# Patient Record
Sex: Female | Born: 1985 | Race: Black or African American | Hispanic: No | Marital: Single | Smoking: Never smoker
Health system: Southern US, Community
[De-identification: ages and names within clinical notes are randomized; demographics above are authoritative.]

## PROBLEM LIST (undated history)

## (undated) DIAGNOSIS — A6000 Herpesviral infection of urogenital system, unspecified: Secondary | ICD-10-CM

## (undated) DIAGNOSIS — R109 Unspecified abdominal pain: Secondary | ICD-10-CM

## (undated) HISTORY — PX: WISDOM TOOTH EXTRACTION: SHX21

## (undated) HISTORY — PX: NO PAST SURGERIES: SHX2092

---
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
## 2006-09-18 ENCOUNTER — Emergency Department (HOSPITAL_COMMUNITY): Admission: EM | Admit: 2006-09-18 | Discharge: 2006-09-18 | Payer: Self-pay | Admitting: Emergency Medicine

## 2006-09-20 ENCOUNTER — Emergency Department (HOSPITAL_COMMUNITY): Admission: EM | Admit: 2006-09-20 | Discharge: 2006-09-20 | Payer: Self-pay | Admitting: Family Medicine

## 2010-06-19 ENCOUNTER — Inpatient Hospital Stay (INDEPENDENT_AMBULATORY_CARE_PROVIDER_SITE_OTHER)
Admission: RE | Admit: 2010-06-19 | Discharge: 2010-06-19 | Disposition: A | Discharge status: Home / Self Care | Payer: 59 | Source: Ambulatory Visit | Attending: Family Medicine | Admitting: Family Medicine

## 2010-06-19 DIAGNOSIS — J029 Acute pharyngitis, unspecified: Secondary | ICD-10-CM

## 2010-06-19 LAB — POCT RAPID STREP A (OFFICE): Streptococcus, Group A Screen (Direct): NEGATIVE

## 2010-06-20 LAB — STREP A DNA PROBE

## 2010-06-21 ENCOUNTER — Inpatient Hospital Stay (INDEPENDENT_AMBULATORY_CARE_PROVIDER_SITE_OTHER)
Admission: RE | Admit: 2010-06-21 | Discharge: 2010-06-21 | Disposition: A | Discharge status: Home / Self Care | Payer: 59 | Source: Ambulatory Visit | Attending: Emergency Medicine | Admitting: Emergency Medicine

## 2010-06-21 DIAGNOSIS — J029 Acute pharyngitis, unspecified: Secondary | ICD-10-CM

## 2010-07-26 ENCOUNTER — Inpatient Hospital Stay (INDEPENDENT_AMBULATORY_CARE_PROVIDER_SITE_OTHER)
Admission: RE | Admit: 2010-07-26 | Discharge: 2010-07-26 | Disposition: A | Discharge status: Home / Self Care | Payer: 59 | Source: Ambulatory Visit | Attending: Emergency Medicine | Admitting: Emergency Medicine

## 2010-07-26 DIAGNOSIS — J020 Streptococcal pharyngitis: Secondary | ICD-10-CM

## 2010-07-26 LAB — POCT RAPID STREP A: Streptococcus, Group A Screen (Direct): POSITIVE — AB

## 2010-11-29 LAB — URINALYSIS, ROUTINE W REFLEX MICROSCOPIC
Bilirubin Urine: NEGATIVE
Nitrite: NEGATIVE
Specific Gravity, Urine: 1.022
Urobilinogen, UA: 0.2

## 2010-11-29 LAB — COMPREHENSIVE METABOLIC PANEL
CO2: 27
Calcium: 9.6
Creatinine, Ser: 0.67
GFR calc non Af Amer: >60
Glucose, Bld: 118 — ABNORMAL HIGH

## 2010-11-29 LAB — URINE MICROSCOPIC-ADD ON

## 2010-11-29 LAB — URINE CULTURE
Colony Count: NO GROWTH
Culture: NO GROWTH

## 2010-11-29 LAB — CBC
Hemoglobin: 13.8
MCHC: 34
MCV: 87.7
RBC: 4.62
RDW: 12.9

## 2010-11-29 LAB — PREGNANCY, URINE: Preg Test, Ur: NEGATIVE

## 2010-11-29 LAB — DIFFERENTIAL
Lymphocytes Relative: 31
Lymphs Abs: 1.3
Neutro Abs: 2.2
Neutrophils Relative %: 54

## 2010-11-29 LAB — LIPASE, BLOOD: Lipase: 26

## 2011-07-02 ENCOUNTER — Encounter (HOSPITAL_COMMUNITY): Payer: Self-pay | Admitting: *Deleted

## 2011-07-02 ENCOUNTER — Emergency Department (HOSPITAL_COMMUNITY)
Admission: EM | Admit: 2011-07-02 | Discharge: 2011-07-02 | Disposition: A | Discharge status: Home / Self Care | Payer: 59 | Source: Home / Self Care | Attending: Emergency Medicine | Admitting: Emergency Medicine

## 2011-07-02 DIAGNOSIS — G510 Bell's palsy: Secondary | ICD-10-CM

## 2011-07-02 HISTORY — DX: Herpesviral infection of urogenital system, unspecified: A60.00

## 2011-07-02 MED ORDER — PREDNISONE 5 MG PO KIT: 1.0000 | PACK | Freq: Every day | ORAL | Status: DC

## 2011-07-02 MED ORDER — VALACYCLOVIR HCL 1 G PO TABS: 1000.0000 mg | ORAL_TABLET | Freq: Three times a day (TID) | ORAL | Status: AC

## 2011-07-02 NOTE — ED Notes (Signed)
Pt with onset of excessive tearing of left eye Thursday evening - Friday am onset of left sided facial drooping and numbness  - left eye not closing completeing continues with excessive tearing

## 2011-07-02 NOTE — ED Provider Notes (Signed)
Chief Complaint  Patient presents with  . Eye Problem  . Facial Droop    History of Present Illness:   April Wells is a 26 year old female who has had a four-day history of excessive tearing of the left eye. The next day she noted left-sided her face was weak and felt somewhat numb. She denies any pain in the face, neck, year, or behind the ear. She has had no headache. She denies any numbness, tingling, or weakness of the upper or lower extremities. No diplopia or blurred vision. No difficulty speaking with the exception of she has trouble pronouncing certain consonant sounds. She denies any eye pain or redness. She is able to completely close her eye on that side. Her left ear feels congested, but she denies any ear pain or changes in her hearing and she denies any dry mouth or changes in her taste. 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  Review of Systems:  Other than noted above, the patient denies any of the following symptoms: Systemic:  No fever, chills, fatigue, photophobia, stiff neck. Eye:  No redness, eye pain, discharge, blurred vision, or diplopia. ENT:  No nasal congestion, rhinorrhea, sinus pressure or pain, sneezing, earache, or sore throat.  No jaw claudication. Neuro:  No paresthesias, loss of consciousness, seizure activity, muscle weakness, trouble with coordination or gait, trouble speaking or swallowing. Psych:  No depression, anxiety or trouble sleeping.  PMFSH:  Past medical history, family history, social history, meds, and allergies were reviewed.  Physical Exam:   Vital signs:  BP 125/69  Pulse 80  Temp(Src) 98.7 F (37.1 C) (Oral)  Resp 18  SpO2 100%  LMP 06/05/2011 General:  Alert and oriented.  In no distress. Eye:  Lids and conjunctivas normal.  PERRL,  Full EOMs.  Fundi benign with normal discs and vessels. ENT:  No cranial or facial tenderness to palpation.  TMs and canals clear.  Nasal mucosa was normal  and uncongested without any drainage. No intra oral lesions, pharynx clear, mucous membranes moist, dentition normal. Neck:  Supple, full ROM, no tenderness to palpation.  No adenopathy or mass. Neuro:  Alert and orented times 3.  Speech was clear, fluent, and appropriate.  Cranial nerve exam reveals a mild left peripheral seventh nerve palsy. She is able to close her eye completely and there is no obvious drooping of her face. No pronator drift, muscle strength normal. Finger to nose normal.  DTRs were 2+ and symmetrical.Station and gait were normal.  Romberg's sign was normal.  Able to perform tandem gait well. Psych:  Normal affect.  Assessment:  The encounter diagnosis was Bell's palsy.  Plan:   1.  The following meds were prescribed:   New Prescriptions   PREDNISONE 5 MG KIT    Take 1 kit (5 mg total) by mouth daily after breakfast. Prednisone 5 mg 6 day dosepack.  Take as directed.   VALACYCLOVIR (VALTREX) 1000 MG TABLET    Take 1 tablet (1,000 mg total) by mouth 3 (three) times daily.   2.  The patient was instructed in symptomatic care and handouts were given. 3.  The patient was told to return if becoming worse in any way, if no better in 3 or 4 days, and given some red flag symptoms that would indicate earlier return.  Follow up:  The patient was told to follow up with Dr. Allyne Gee, her primary care physician, in one week.  Reuben Likes, MD 07/02/11 [phone]

## 2011-07-02 NOTE — Discharge Instructions (Signed)
Use Systane eyedrops and gel.  Protect eyes from foreign bodies.  Bell's Palsy Bell's palsy is a condition in which the muscles on one side of the face cannot move (paralysis). This is because the nerves in the face are paralyzed. It is most often thought to be caused by a virus. The virus causes swelling of the nerve that controls movement on one side of the face. The nerve travels through a tight space surrounded by bone. When the nerve swells, it can be compressed by the bone. This results in damage to the protective covering around the nerve. This damage interferes with how the nerve communicates with the muscles of the face. As a result, it can cause weakness or paralysis of the facial muscles.  Injury (trauma), tumor, and surgery may cause Bell's palsy, but most of the time the cause is unknown. It is a relatively common condition. It starts suddenly (abrupt onset) with the paralysis usually ending within 2 days. Bell's palsy is not dangerous. But because the eye does not close properly, you may need care to keep the eye from getting dry. This can include splinting (to keep the eye shut) or moistening with artificial tears. Bell's palsy very seldom occurs on both sides of the face at the same time. SYMPTOMS   Eyebrow sagging.   Drooping of the eyelid and corner of the mouth.   Inability to close one eye.   Loss of taste on the front of the tongue.   Sensitivity to loud noises.  TREATMENT  The treatment is usually non-surgical. If the patient is seen within the first 24 to 48 hours, a short course of steroids may be prescribed, in an attempt to shorten the length of the condition. Antiviral medicines may also be used with the steroids, but it is unclear if they are helpful.  You will need to protect your eye, if you cannot close it. The cornea (clear covering over your eye) will become dry and can be damaged. Artificial tears can be used to keep your eye moist. Glasses or an eye patch should  be worn to protect your eye. PROGNOSIS  Recovery is variable, ranging from days to months. Although the problem usually goes away completely (about 80% of cases resolve), predicting the outcome is impossible. Most people improve within 3 weeks of when the symptoms began. Improvement may continue for 3 to 6 months. A small number of people have moderate to severe weakness that is permanent.  HOME CARE INSTRUCTIONS   If your caregiver prescribed medication to reduce swelling in the nerve, use as directed. Do not stop taking the medication unless directed by your caregiver.   Use moisturizing eye drops as needed to prevent drying of your eye, as directed by your caregiver.   Protect your eye, as directed by your caregiver.   Use facial massage and exercises, as directed by your caregiver.   Perform your normal activities, and get your normal rest.  SEEK IMMEDIATE MEDICAL CARE IF:   There is pain, redness or irritation in the eye.   You or your child has an oral temperature above 102 F (38.9 C), not controlled by medicine.  MAKE SURE YOU:   Understand these instructions.   Will watch your condition.   Will get help right away if you are not doing well or get worse.  Document Released: 01/31/2005 Document Revised: 01/20/2011 Document Reviewed: 02/09/2009 [facility] Patient Information 2012 [place], [place].

## 2018-08-08 ENCOUNTER — Encounter (HOSPITAL_COMMUNITY): Payer: Self-pay

## 2018-08-08 ENCOUNTER — Encounter (HOSPITAL_COMMUNITY): Payer: Self-pay | Admitting: Emergency Medicine

## 2018-08-08 ENCOUNTER — Other Ambulatory Visit: Payer: Self-pay

## 2018-08-08 ENCOUNTER — Observation Stay (HOSPITAL_COMMUNITY): Payer: BC Managed Care – PPO

## 2018-08-08 ENCOUNTER — Observation Stay (HOSPITAL_COMMUNITY)
Admission: EM | Admit: 2018-08-08 | Discharge: 2018-08-10 | Disposition: A | Discharge status: Home / Self Care | Payer: BC Managed Care – PPO | Attending: Physician Assistant | Admitting: Physician Assistant

## 2018-08-08 ENCOUNTER — Ambulatory Visit (INDEPENDENT_AMBULATORY_CARE_PROVIDER_SITE_OTHER)
Admission: EM | Admit: 2018-08-08 | Discharge: 2018-08-08 | Disposition: A | Discharge status: Home / Self Care | Payer: BC Managed Care – PPO | Source: Home / Self Care

## 2018-08-08 ENCOUNTER — Emergency Department (HOSPITAL_COMMUNITY): Payer: BC Managed Care – PPO

## 2018-08-08 DIAGNOSIS — E876 Hypokalemia: Secondary | ICD-10-CM | POA: Insufficient documentation

## 2018-08-08 DIAGNOSIS — K529 Noninfective gastroenteritis and colitis, unspecified: Secondary | ICD-10-CM | POA: Diagnosis not present

## 2018-08-08 DIAGNOSIS — R1031 Right lower quadrant pain: Secondary | ICD-10-CM

## 2018-08-08 DIAGNOSIS — Z1159 Encounter for screening for other viral diseases: Secondary | ICD-10-CM | POA: Insufficient documentation

## 2018-08-08 DIAGNOSIS — R1011 Right upper quadrant pain: Secondary | ICD-10-CM

## 2018-08-08 DIAGNOSIS — K8020 Calculus of gallbladder without cholecystitis without obstruction: Secondary | ICD-10-CM | POA: Diagnosis not present

## 2018-08-08 DIAGNOSIS — R1084 Generalized abdominal pain: Secondary | ICD-10-CM

## 2018-08-08 DIAGNOSIS — R109 Unspecified abdominal pain: Secondary | ICD-10-CM | POA: Diagnosis present

## 2018-08-08 DIAGNOSIS — R1030 Lower abdominal pain, unspecified: Secondary | ICD-10-CM | POA: Diagnosis present

## 2018-08-08 HISTORY — DX: Unspecified abdominal pain: R10.9

## 2018-08-08 LAB — CBC
HCT: 40.1 % (ref 36.0–46.0)
Hemoglobin: 14 g/dL (ref 12.0–15.0)
MCH: 29.9 pg (ref 26.0–34.0)
MCHC: 34.9 g/dL (ref 30.0–36.0)
MCV: 85.7 fL (ref 80.0–100.0)
Platelets: 201 10*3/uL (ref 150–400)
RBC: 4.68 MIL/uL (ref 3.87–5.11)
RDW: 13.1 % (ref 11.5–15.5)
WBC: 20.9 10*3/uL — ABNORMAL HIGH (ref 4.0–10.5)
nRBC: 0 % (ref 0.0–0.2)

## 2018-08-08 LAB — COMPREHENSIVE METABOLIC PANEL
ALT: 25 U/L (ref 0–44)
AST: 17 U/L (ref 15–41)
Albumin: 4.3 g/dL (ref 3.5–5.0)
Alkaline Phosphatase: 69 U/L (ref 38–126)
Anion gap: 14 (ref 5–15)
BUN: 8 mg/dL (ref 6–20)
CO2: 20 mmol/L — ABNORMAL LOW (ref 22–32)
Calcium: 10 mg/dL (ref 8.9–10.3)
Chloride: 106 mmol/L (ref 98–111)
Creatinine, Ser: 0.97 mg/dL (ref 0.44–1.00)
GFR calc Af Amer: >60 mL/min (ref >60)
GFR calc non Af Amer: >60 mL/min (ref >60)
Glucose, Bld: 150 mg/dL — ABNORMAL HIGH (ref 70–99)
Potassium: 3.6 mmol/L (ref 3.5–5.1)
Sodium: 140 mmol/L (ref 135–145)
Total Bilirubin: 1.1 mg/dL (ref 0.3–1.2)
Total Protein: 9.5 g/dL — ABNORMAL HIGH (ref 6.5–8.1)

## 2018-08-08 LAB — SARS CORONAVIRUS 2 BY RT PCR (HOSPITAL ORDER, PERFORMED IN ~~LOC~~ HOSPITAL LAB): SARS Coronavirus 2: NEGATIVE

## 2018-08-08 LAB — LIPASE, BLOOD: Lipase: 23 U/L (ref 11–51)

## 2018-08-08 LAB — I-STAT BETA HCG BLOOD, ED (MC, WL, AP ONLY): I-stat hCG, quantitative: <5 m[IU]/mL (ref <5)

## 2018-08-08 LAB — POCT PREGNANCY, URINE: Preg Test, Ur: NEGATIVE

## 2018-08-08 MED ORDER — PANTOPRAZOLE SODIUM 40 MG IV SOLR
40.0000 mg | Freq: Every day | INTRAVENOUS | Status: DC
  Administered 2018-08-08 – 2018-08-09 (×2): 40 mg via INTRAVENOUS
  Filled 2018-08-08 (×2): qty 40

## 2018-08-08 MED ORDER — DIPHENHYDRAMINE HCL 50 MG/ML IJ SOLN: 25.0000 mg | Freq: Four times a day (QID) | INTRAMUSCULAR | Status: DC | PRN

## 2018-08-08 MED ORDER — DOCUSATE SODIUM 100 MG PO CAPS
100.0000 mg | ORAL_CAPSULE | Freq: Two times a day (BID) | ORAL | Status: DC
  Administered 2018-08-08 (×2): 100 mg via ORAL
  Filled 2018-08-08 (×2): qty 1

## 2018-08-08 MED ORDER — POLYETHYLENE GLYCOL 3350 17 G PO PACK: 17.0000 g | PACK | Freq: Every day | ORAL | Status: DC | PRN

## 2018-08-08 MED ORDER — ONDANSETRON HCL 4 MG/2ML IJ SOLN: 4.0000 mg | Freq: Four times a day (QID) | INTRAMUSCULAR | Status: DC | PRN

## 2018-08-08 MED ORDER — METOPROLOL TARTRATE 5 MG/5ML IV SOLN: 5.0000 mg | Freq: Four times a day (QID) | INTRAVENOUS | Status: DC | PRN

## 2018-08-08 MED ORDER — MORPHINE SULFATE (PF) 4 MG/ML IV SOLN
4.0000 mg | Freq: Once | INTRAVENOUS | Status: AC
  Administered 2018-08-08: 4 mg via INTRAVENOUS
  Filled 2018-08-08: qty 1

## 2018-08-08 MED ORDER — ONDANSETRON HCL 4 MG/2ML IJ SOLN
4.0000 mg | Freq: Once | INTRAMUSCULAR | Status: AC
  Administered 2018-08-08: 4 mg via INTRAVENOUS
  Filled 2018-08-08: qty 2

## 2018-08-08 MED ORDER — ACETAMINOPHEN 325 MG PO TABS: 650.0000 mg | ORAL_TABLET | Freq: Four times a day (QID) | ORAL | Status: DC | PRN

## 2018-08-08 MED ORDER — ENOXAPARIN SODIUM 40 MG/0.4ML ~~LOC~~ SOLN
40.0000 mg | SUBCUTANEOUS | Status: DC
  Administered 2018-08-08 – 2018-08-09 (×2): 40 mg via SUBCUTANEOUS
  Filled 2018-08-08 (×2): qty 0.4

## 2018-08-08 MED ORDER — KCL IN DEXTROSE-NACL 20-5-0.45 MEQ/L-%-% IV SOLN
INTRAVENOUS | Status: DC
  Administered 2018-08-08 – 2018-08-09 (×2): via INTRAVENOUS
  Filled 2018-08-08 (×3): qty 1000

## 2018-08-08 MED ORDER — HYDROMORPHONE HCL 1 MG/ML IJ SOLN: 0.5000 mg | INTRAMUSCULAR | Status: DC | PRN

## 2018-08-08 MED ORDER — ONDANSETRON 4 MG PO TBDP: 4.0000 mg | ORAL_TABLET | Freq: Four times a day (QID) | ORAL | Status: DC | PRN

## 2018-08-08 MED ORDER — PIPERACILLIN-TAZOBACTAM 3.375 G IVPB
3.3750 g | Freq: Three times a day (TID) | INTRAVENOUS | Status: DC
  Administered 2018-08-08 – 2018-08-10 (×6): 3.375 g via INTRAVENOUS
  Filled 2018-08-08 (×7): qty 50

## 2018-08-08 MED ORDER — ACETAMINOPHEN 650 MG RE SUPP: 650.0000 mg | Freq: Four times a day (QID) | RECTAL | Status: DC | PRN

## 2018-08-08 MED ORDER — IOHEXOL 300 MG/ML  SOLN
100.0000 mL | Freq: Once | INTRAMUSCULAR | Status: AC | PRN
  Administered 2018-08-08: 100 mL via INTRAVENOUS

## 2018-08-08 MED ORDER — KETOROLAC TROMETHAMINE 30 MG/ML IJ SOLN
30.0000 mg | Freq: Four times a day (QID) | INTRAMUSCULAR | Status: DC | PRN
  Administered 2018-08-08 – 2018-08-09 (×2): 30 mg via INTRAVENOUS
  Filled 2018-08-08 (×2): qty 1

## 2018-08-08 MED ORDER — OXYCODONE HCL 5 MG PO TABS
5.0000 mg | ORAL_TABLET | ORAL | Status: DC | PRN
  Administered 2018-08-08: 5 mg via ORAL
  Filled 2018-08-08 (×2): qty 2

## 2018-08-08 MED ORDER — DIPHENHYDRAMINE HCL 25 MG PO CAPS: 25.0000 mg | ORAL_CAPSULE | Freq: Four times a day (QID) | ORAL | Status: DC | PRN

## 2018-08-08 MED ORDER — POLYETHYLENE GLYCOL 3350 17 G PO PACK
17.0000 g | PACK | Freq: Once | ORAL | Status: AC
  Administered 2018-08-08: 17 g via ORAL
  Filled 2018-08-08: qty 1

## 2018-08-08 MED ORDER — SODIUM CHLORIDE 0.9% FLUSH
3.0000 mL | Freq: Once | INTRAVENOUS | Status: AC
  Administered 2018-08-08: 12:00:00 3 mL via INTRAVENOUS

## 2018-08-08 NOTE — ED Provider Notes (Signed)
[place] EMERGENCY DEPARTMENT Provider Note   CSN: [id number] Arrival date & time: 08/08/18  0944     History   Chief Complaint Chief Complaint  Patient presents with  . Abdominal Pain    HPI April Wells is a 33 y.o. female.  Who presents the emergency department sent from [facility] urgent care for evaluation of abdominal pain.  Patient states that she began having pain in her umbilical area and across the upper part of her abdomen 2 days ago.  She states that she felt bloated and distended.  She has constant pain and has been unable to find a position of comfort.  She had several episodes of nonbloody nonbilious vomitus yesterday.  She took Tylenol.  She took several warm baths.  She tried using MiraLAX.  Her mother helped her take an enema.  She did not make a bowel movement since 2 days ago which is very abnormal for her.  She denies diarrhea.  She has no previous abdominal surgeries.  The previous provider examined her abdomen and felt she was tender in the right lower quadrant.  The patient denies any urinary symptoms.  She had her first.  In a very long time starting last Friday.  She has been on Depo-Provera and has not had a period in 5 years.     HPI  Past Medical History:  Diagnosis Date  . Herpes genitalia     There are no active problems to display for this patient.   History reviewed. No pertinent surgical history.   OB History   No obstetric history on file.      Home Medications    Prior to Admission medications   Medication Sig Start Date End Date Taking? Authorizing Provider  PredniSONE 5 MG KIT Take 1 kit (5 mg total) by mouth daily after breakfast. Prednisone 5 mg 6 day dosepack.  Take as directed. 07/02/11   Harden Mo, MD  valACYclovir (VALTREX) 1000 MG tablet Take 1,000 mg by mouth 2 (two) times daily.    [provider]    Family History No family history on file.  Social History Social History   Tobacco  Use  . Smoking status: Never Smoker  . Smokeless tobacco: Never Used  Substance Use Topics  . Alcohol use: Yes  . Drug use: No     Allergies   Patient has no known allergies.   Review of Systems Review of Systems Ten systems reviewed and are negative for acute change, except as noted in the HPI.    Physical Exam Updated Vital Signs BP (!) 142/90 (BP Location: Left Arm)   Temp 98.6 F (37 C) (Oral)   Resp 20   Ht '5\' 8"'  (1.727 m)   Wt 106 kg   SpO2 96%   BMI 35.53 kg/m   Physical Exam Vitals signs and nursing note reviewed.  Constitutional:      General: She is not in acute distress.    Appearance: She is well-developed. She is not diaphoretic.  HENT:     Head: Normocephalic and atraumatic.  Eyes:     General: No scleral icterus.    Conjunctiva/sclera: Conjunctivae normal.  Neck:     Musculoskeletal: Normal range of motion.  Cardiovascular:     Rate and Rhythm: Normal rate and regular rhythm.     Heart sounds: Normal heart sounds. No murmur. No friction rub. No gallop.   Pulmonary:     Effort: Pulmonary effort is normal. No  respiratory distress.     Breath sounds: Normal breath sounds.  Abdominal:     General: Bowel sounds are normal. There is no distension.     Palpations: Abdomen is soft. There is no mass.     Tenderness: There is abdominal tenderness in the right upper quadrant and epigastric area. There is no right CVA tenderness, left CVA tenderness, guarding or rebound.  Skin:    General: Skin is warm and dry.  Neurological:     Mental Status: She is alert and oriented to person, place, and time.  Psychiatric:        Behavior: Behavior normal.      ED Treatments / Results  Labs (all labs ordered are listed, but only abnormal results are displayed) Labs Reviewed  COMPREHENSIVE METABOLIC PANEL - Abnormal; Notable for the following components:      Result Value   CO2 20 (*)    Glucose, Bld 150 (*)    Total Protein 9.5 (*)    All other components  within normal limits  CBC - Abnormal; Notable for the following components:   WBC 20.9 (*)    All other components within normal limits  LIPASE, BLOOD  URINALYSIS, ROUTINE W REFLEX MICROSCOPIC  URINALYSIS, COMPLETE (UACMP) WITH MICROSCOPIC  I-STAT BETA HCG BLOOD, ED ([facility], [facility], [facility] ONLY)  I-STAT BETA HCG BLOOD, ED ([facility], [facility], [facility] ONLY)    EKG    Radiology No results found.  Procedures Procedures (including critical care time)  Medications Ordered in ED Medications  sodium chloride flush (NS) 0.9 % injection 3 mL (3 mLs Intravenous Given 08/08/18 1130)  ondansetron (ZOFRAN) injection 4 mg (4 mg Intravenous Given 08/08/18 1131)  morphine 4 MG/ML injection 4 mg (4 mg Intravenous Given 08/08/18 1131)     Initial Impression / Assessment and Plan / ED Course  I have reviewed the triage vital signs and the nursing notes.  Pertinent labs & imaging results that were available during my care of the patient were reviewed by me and considered in my medical decision making (see chart for details).  Clinical Course as of Aug 09 1354  Wed Aug 08, 2018  1255 Patient with large amount of gallstones.  She is tender in the right upper quadrant.  I placed a call to the general surgeon to ask for consult on the patient.   [AH]    Clinical Course User Index [AH] Margarita Mail, PA-C       33 year old female here with abdominal pain.  On my examination the patient is tender to palpation in the right upper quadrant.  She is nontender in the right lower quadrant.  Differential diagnosis of her lower abdominal considerations include pelvic inflammatory disease, ectopic pregnancy, appendicitis, urinary calculi, primary dysmenorrhea, septic abortion, ruptured ovarian cyst or tumor, ovarian torsion, tubo-ovarian abscess, degeneration of fibroid, endometriosis, diverticulitis, cystitis.  Will begin the examination with a right upper quadrant abdominal ultrasound.The emergent DDX for RUQ pain includes but is  not limited to Glabladder disease, PUD, Acute Hepatitis, Pancreatitis, pyelonephritis, Pneumonia, Lower lobe PE/Infarct, Kidney stone, GERD, retrocecal appendicitis, Fitz-Hugh-Curtis syndrome, AAA, MI, Zoster.  Of note the patient does appear to have an elevated white blood cell count of 20,000.  Her blood sugar is elevated which may be all secondary to acute phase reaction.  She rates her pain at 7 out of 10 and I have offered pain and antinausea medication.  She has no active vomiting, currently.    Patient seen in the emergency department  by PA Claiborne Billings Rayburn off the surgical service.  She will admit the patient for further work-up and observation.  She has ordered a CT scan of the abdomen.  Patient understands and is agreeable with the plan.  I personally reviewed the patient's labs which show negative pregnancy test, leukocytosis of 20,000 without other abnormality.  Blood glucose is 150 patient's COVID test is pending.  Lipase is within normal limits.  I reviewed the patient's right upper quadrant ultrasound which shows multiple gallstones without bladder wall thickening or CBD dilatation.  Patient is stable throughout her ER course and appropriate for admission at this time  Final Clinical Impressions(s) / ED Diagnoses   Final diagnoses:  RUQ abdominal pain    ED Discharge Orders    None       Margarita Mail, PA-C 08/09/18 1401    Virgel Manifold, MD 08/12/18 1113

## 2018-08-08 NOTE — ED Notes (Signed)
Pt. States she is unable to provide urine because she has not had anything to drink.

## 2018-08-08 NOTE — ED Notes (Signed)
Pt. Aware urine is needed. States she is unable to provide any at this time.  

## 2018-08-08 NOTE — ED Notes (Signed)
Patient transported to US 

## 2018-08-08 NOTE — Discharge Instructions (Addendum)
33 year old female presenting for right lower quadrant pain.  Patient states onset was 2 days ago, has never had abdominal surgery.  Patient is very concerned about her appendix, requesting CAT scan.  Discussed that this is not able to be done in the urgent care setting, patient requesting go to ER for further evaluation.

## 2018-08-08 NOTE — ED Triage Notes (Signed)
Onset 3 days ago developed RLQ pain worsening overtime. Seen at urgent care sent to the ED for evaluation.

## 2018-08-08 NOTE — ED Triage Notes (Signed)
Pt states she has been having cramps and abdominal pain x 5 days. Pt states she has not been able sleep. Pt is having right pain.

## 2018-08-08 NOTE — H&P (Signed)
[facility] Admission Note  April Wells Jul 11, 1985  [id number].    Requesting MD: Arthor CaptainAbigail Harris PA-C Chief Complaint/Reason for Consult: Abdominal pain  HPI:  Patient is an otherwise healthy 33 year old female who presented to urgent care this AM with abdominal pain. Pain began 2 days ago and was more located in lower abdomen, felt like what patient describes as menstrual cramps. Pain is now more in upper abdomen on the right. She reports feeling bloated and constipation as well. Patient tried heat, taking baths, antacids, laxatives and an enema with no resolution in pain. She has also had nausea and non-bloody, non-bilious emesis. Last BM was 2 days ago. Denies fever or chills, chest pain, SOB, urinary symptoms. Denies vaginal discharge or pelvic pain but reports LMP 08/03/18. She does not usually have periods as she is on depo-provera. Patient denies past abdominal surgeries. No blood thinning medications. No recent abx or steroids. NKDA. Patient reports occasional alcohol use, denies illicit drug or tobacco use.   ROS: Review of Systems  Constitutional: Negative for chills and fever.  Respiratory: Negative for shortness of breath and wheezing.   Cardiovascular: Negative for chest pain and palpitations.  Gastrointestinal: Positive for abdominal pain, constipation, nausea and vomiting. Negative for blood in stool, diarrhea and melena.  Genitourinary: Negative for dysuria, frequency and urgency.  All other systems reviewed and are negative.   No family history on file.  Past Medical History:  Diagnosis Date  . Herpes genitalia     History reviewed. No pertinent surgical history.  Social History:  reports that she has never smoked. She has never used smokeless tobacco. She reports current alcohol use. She reports that she does not use drugs.  Allergies: No Known Allergies  (Not in a hospital admission)   Blood pressure 116/73, pulse 94, temperature 98.6 F (37 C),  temperature source Oral, resp. rate 17, height 5\' 8"  (1.727 m), weight 106 kg, SpO2 100 %. Physical Exam: Physical Exam Constitutional:      General: She is not in acute distress.    Appearance: She is well-developed. She is obese.  HENT:     Head: Normocephalic and atraumatic.     Right Ear: External ear normal.     Left Ear: External ear normal.     Nose: Nose normal.     Mouth/Throat:     Lips: Pink.     Mouth: Mucous membranes are moist.  Eyes:     General: Lids are normal. No scleral icterus.    Extraocular Movements: Extraocular movements intact.     Conjunctiva/sclera: Conjunctivae normal.  Neck:     Musculoskeletal: Normal range of motion and neck supple.  Cardiovascular:     Rate and Rhythm: Normal rate and regular rhythm.     Pulses:          Radial pulses are 2+ on the right side and 2+ on the left side.       Dorsalis pedis pulses are 2+ on the right side and 2+ on the left side.  Pulmonary:     Effort: Pulmonary effort is normal.     Breath sounds: Normal breath sounds. No decreased breath sounds, wheezing, rhonchi or rales.  Abdominal:     General: Bowel sounds are normal. There is no distension.     Palpations: Abdomen is soft. There is no hepatomegaly or splenomegaly.     Tenderness: There is abdominal tenderness in the right upper quadrant. There is no guarding or rebound. Positive signs  include Murphy's sign.  Musculoskeletal:     Right lower leg: No edema.     Left lower leg: No edema.     Comments: ROM grossly intact in bilateral upper and lower extremities  Skin:    General: Skin is warm and dry.     Findings: No rash.  Neurological:     Mental Status: She is alert and oriented to person, place, and time.  Psychiatric:        Attention and Perception: Attention and perception normal.        Mood and Affect: Mood and affect normal.        Speech: Speech normal.        Behavior: Behavior normal. Behavior is cooperative.     Results for orders placed  or performed during the hospital encounter of 08/08/18 (from the past 48 hour(s))  Lipase, blood     Status: None   Collection Time: 08/08/18 10:04 AM  Result Value Ref Range   Lipase 23 11 - 51 U/L    Comment: Performed at [facility], [address] [place], [place] [address]  Comprehensive metabolic panel     Status: Abnormal   Collection Time: 08/08/18 10:04 AM  Result Value Ref Range   Sodium 140 135 - 145 mmol/L   Potassium 3.6 3.5 - 5.1 mmol/L   Chloride 106 98 - 111 mmol/L   CO2 20 (L) 22 - 32 mmol/L   Glucose, Bld 150 (H) 70 - 99 mg/dL   BUN 8 6 - 20 mg/dL   Creatinine, Ser 6.040.97 0.44 - 1.00 mg/dL   Calcium 54.010.0 8.9 - 98.110.3 mg/dL   Total Protein 9.5 (H) 6.5 - 8.1 g/dL   Albumin 4.3 3.5 - 5.0 g/dL   AST 17 15 - 41 U/L   ALT 25 0 - 44 U/L   Alkaline Phosphatase 69 38 - 126 U/L   Total Bilirubin 1.1 0.3 - 1.2 mg/dL   GFR calc non Af Amer >60 >60 mL/min   GFR calc Af Amer >60 >60 mL/min   Anion gap 14 5 - 15    Comment: Performed at [facility], [address] [place], [place] [address]  CBC     Status: Abnormal   Collection Time: 08/08/18 10:04 AM  Result Value Ref Range   WBC 20.9 (H) 4.0 - 10.5 K/uL   RBC 4.68 3.87 - 5.11 MIL/uL   Hemoglobin 14.0 12.0 - 15.0 g/dL   HCT 82.940.1 56.236.0 - 13.046.0 %   MCV 85.7 80.0 - 100.0 fL   MCH 29.9 26.0 - 34.0 pg   MCHC 34.9 30.0 - 36.0 g/dL   RDW 86.513.1 78.411.5 - 69.615.5 %   Platelets 201 150 - 400 K/uL   nRBC 0.0 0.0 - 0.2 %    Comment: Performed at [facility], [address] [place], [place] [address]  I-Stat beta hCG blood, ED     Status: None   Collection Time: 08/08/18 10:45 AM  Result Value Ref Range   I-stat hCG, quantitative <5.0 <5 mIU/mL   Comment 3            Comment:   GEST. AGE      CONC.  (mIU/mL)   <=1 WEEK        5 - 50     2 WEEKS       50 - 500     3 WEEKS       100 -  10,000     4 WEEKS     1,000 - 30,000        FEMALE AND NON-PREGNANT FEMALE:     LESS THAN 5 mIU/mL    US Abdomen Limited  Ruq  Result Date: 08/08/2018 CLINICAL DATA:  Right upper quadrant pain, nausea and vomiting for 3 days. EXAM: ULTRASOUND ABDOMEN LIMITED RIGHT UPPER QUADRANT COMPARISON:  None. FINDINGS: Gallbladder: Stones are identified in the gallbladder measuring up to 1.6 cm in diameter. There is no gallbladder wall thickening or pericholecystic fluid. Sonographer reports negative Murphy's sign. Common bile duct: Diameter: 0.2 cm Liver: No focal lesion identified. Within normal limits in parenchymal echogenicity. Portal vein is patent on color Doppler imaging with normal direction of blood flow towards the liver. IMPRESSION: Gallstones without evidence of cholecystitis. Electronically Signed   By: Inge Rise M.D.   On: 08/08/2018 12:45      Assessment/Plan Cholelithiasis Abdominal pain - pain initially in lower abdomen but now localized to RUQ - RUQ US shows stones but no gallbladder wall thickening or pericholecystic fluid - could be early cholecystitis or biliary colic  - patient clinically tender in RUQ, but no peritonitis - will get CT abdomen/pelvis to rule out other causes of abdominal with leukocytosis - ok to have liquids for now, will make NPO after midnight for possible OR tomorrow pending CT findings  Leukocytosis - WBC 20, afebrile  FEN: FLD, IVF VTE: SCDs, lovenox ID: [id number] 6/24>>  Admit to observation and repeat labs in AM. CT abd/pelvis to rule out other causes of leukocytosis with abdominal pain. Ok to have liquids tonight, NPO after midnight. IVF and IV abx.   Brigid Re, [facility] 08/08/2018, 1:49 PM Pager: [phone] Consults: [phone]

## 2018-08-08 NOTE — ED Provider Notes (Signed)
[facility]    CSN: [id number] Arrival date & time: 08/08/18  0820     History   Chief Complaint Chief Complaint  Patient presents with  . Appointment    830  . Abdominal Pain    HPI April Wells is a 33 y.o. female her right lower quadrant pain.  Patient states that symptoms onset was 2 days ago.  Patient states that pain is very intense, keeping her up at night.  Patient states that she was having some suprapubic cramping, "like a period" a few days prior, though that is since gone away.  Patient does have a baseline of constipation: Last bowel movement 2 days ago that was without blood or melena.  Patient states that she has tried everything for her pain including heating pads, ibuprofen, Tylenol, topical analgesics, massage without relief.  She states now she feels like she cannot get comfortable.  "The pain hurts no matter what I do ".  Patient is endorsing nausea, no vomiting.  She is afebrile, no known sick contacts or diarrhea.  States that she has been on Depakote for 5 years, currently sexually active with one female partner not wearing condoms routinely.  Patient denies vaginal discharge, vaginal or pelvic pain, vaginal malodor, urinary frequency/urgency, burning with urination.  LMP 08/03/18 - which is "not regular for me because I'm on depo".    Past Medical History:  Diagnosis Date  . Herpes genitalia     There are no active problems to display for this patient.   History reviewed. No pertinent surgical history.  OB History   No obstetric history on file.      Home Medications    Prior to Admission medications   Medication Sig Start Date End Date Taking? Authorizing Provider  PredniSONE 5 MG KIT Take 1 kit (5 mg total) by mouth daily after breakfast. Prednisone 5 mg 6 day dosepack.  Take as directed. 07/02/11   Harden Mo, MD  valACYclovir (VALTREX) 1000 MG tablet Take 1,000 mg by mouth 2 (two) times daily.    [provider]     Family History No family history on file.  Social History Social History   Tobacco Use  . Smoking status: Never Smoker  . Smokeless tobacco: Never Used  Substance Use Topics  . Alcohol use: Yes  . Drug use: No     Allergies   Patient has no known allergies.   Review of Systems As per HPI   Physical Exam Triage Vital Signs ED Triage Vitals [08/08/18 0833]  Enc Vitals Group     BP 109/75     Pulse Rate 99     Resp 18     Temp      Temp Source Oral     SpO2 100 %     Weight      Height      Head Circumference      Peak Flow      Pain Score      Pain Loc      Pain Edu?      Excl. in [place]?    No data found.  Updated Vital Signs BP 109/75 (BP Location: Right Arm)   Pulse 99   Temp 98.3 F (36.8 C) (Oral)   Resp 18   Wt 220 lb (99.8 kg)   SpO2 100%    Physical Exam Constitutional:      General: She is not in acute distress. HENT:     Head:  Normocephalic and atraumatic.  Eyes:     General: No scleral icterus.    Pupils: Pupils are equal, round, and reactive to light.  Cardiovascular:     Rate and Rhythm: Normal rate.  Pulmonary:     Effort: Pulmonary effort is normal.  Abdominal:     General: Abdomen is protuberant. Bowel sounds are normal. There is no distension. There are no signs of injury.     Palpations: Abdomen is soft. There is no hepatomegaly or splenomegaly.     Tenderness: There is abdominal tenderness in the right lower quadrant. There is no right CVA tenderness, left CVA tenderness or rebound. Positive signs include McBurney's sign. Negative signs include Murphy's sign, Rovsing's sign and obturator sign.  Skin:    General: Skin is warm.     Coloration: Skin is not cyanotic, jaundiced, mottled or pale.  Neurological:     Mental Status: She is alert and oriented to person, place, and time.      UC Treatments / Results  Labs (all labs ordered are listed, but only abnormal results are displayed) Labs Reviewed  POC URINE PREG, ED  POCT  PREGNANCY, URINE    EKG None  Radiology No results found.  Procedures Procedures (including critical care time)  Medications Ordered in UC Medications - No data to display  Initial Impression / Assessment and Plan / UC Course  I have reviewed the triage vital signs and the nursing notes.  Pertinent labs & imaging results that were available during my care of the patient were reviewed by me and considered in my medical decision making (see chart for details).     Patient hemodynamically stable, physical exam is somewhat unremarkable giving patient's reported pain level.  I am not sure that acute appendicitis is most likely though it cannot be ruled out definitively in the urgent care setting.  Urine pregnant negative in office.  DDX including ovarian torsion, ovarian cyst, constipation, gas, menstrual cramps, ectopic pregnancy. Final Clinical Impressions(s) / UC Diagnoses   Final diagnoses:  Pain, abdominal, RLQ     Discharge Instructions     33 year old female presenting for right lower quadrant pain.  Patient states onset was 2 days ago, has never had abdominal surgery.  Patient is very concerned about her appendix, requesting CAT scan.  Discussed that this is not able to be done in the urgent care setting, patient requesting go to ER for further evaluation.    ED Prescriptions    None     Controlled Substance Prescriptions [place] Controlled Substance Registry consulted? Not Applicable   Quincy Sheehan, [place] 08/08/18 6440

## 2018-08-09 ENCOUNTER — Encounter (HOSPITAL_COMMUNITY): Payer: Self-pay | Admitting: General Practice

## 2018-08-09 LAB — COMPREHENSIVE METABOLIC PANEL
ALT: 23 U/L (ref 0–44)
AST: 14 U/L — ABNORMAL LOW (ref 15–41)
Albumin: 3.6 g/dL (ref 3.5–5.0)
Alkaline Phosphatase: 59 U/L (ref 38–126)
Anion gap: 10 (ref 5–15)
BUN: 7 mg/dL (ref 6–20)
CO2: 25 mmol/L (ref 22–32)
Calcium: 9.4 mg/dL (ref 8.9–10.3)
Chloride: 104 mmol/L (ref 98–111)
Creatinine, Ser: 0.96 mg/dL (ref 0.44–1.00)
GFR calc Af Amer: >60 mL/min (ref >60)
GFR calc non Af Amer: >60 mL/min (ref >60)
Glucose, Bld: 160 mg/dL — ABNORMAL HIGH (ref 70–99)
Potassium: 3 mmol/L — ABNORMAL LOW (ref 3.5–5.1)
Sodium: 139 mmol/L (ref 135–145)
Total Bilirubin: 0.7 mg/dL (ref 0.3–1.2)
Total Protein: 7.8 g/dL (ref 6.5–8.1)

## 2018-08-09 LAB — CBC
HCT: 34.8 % — ABNORMAL LOW (ref 36.0–46.0)
Hemoglobin: 12.2 g/dL (ref 12.0–15.0)
MCH: 29.7 pg (ref 26.0–34.0)
MCHC: 35.1 g/dL (ref 30.0–36.0)
MCV: 84.7 fL (ref 80.0–100.0)
Platelets: 188 10*3/uL (ref 150–400)
RBC: 4.11 MIL/uL (ref 3.87–5.11)
RDW: 13.2 % (ref 11.5–15.5)
WBC: 10.9 10*3/uL — ABNORMAL HIGH (ref 4.0–10.5)
nRBC: 0 % (ref 0.0–0.2)

## 2018-08-09 LAB — SURGICAL PCR SCREEN
MRSA, PCR: NEGATIVE
Staphylococcus aureus: NEGATIVE

## 2018-08-09 LAB — URINALYSIS, COMPLETE (UACMP) WITH MICROSCOPIC
Bilirubin Urine: NEGATIVE
Glucose, UA: NEGATIVE mg/dL
Ketones, ur: 5 mg/dL — AB
Nitrite: NEGATIVE
Protein, ur: 30 mg/dL — AB
Specific Gravity, Urine: 1.031 — ABNORMAL HIGH (ref 1.005–1.030)
pH: 5 (ref 5.0–8.0)

## 2018-08-09 LAB — HIV ANTIBODY (ROUTINE TESTING W REFLEX): HIV Screen 4th Generation wRfx: NONREACTIVE

## 2018-08-09 LAB — C DIFFICILE QUICK SCREEN W PCR REFLEX
C Diff antigen: NEGATIVE
C Diff interpretation: NOT DETECTED
C Diff toxin: NEGATIVE

## 2018-08-09 MED ORDER — POTASSIUM CHLORIDE 10 MEQ/100ML IV SOLN
10.0000 meq | INTRAVENOUS | Status: AC
  Administered 2018-08-09 (×4): 10 meq via INTRAVENOUS
  Filled 2018-08-09 (×4): qty 100

## 2018-08-09 MED ORDER — POTASSIUM CHLORIDE CRYS ER 20 MEQ PO TBCR
20.0000 meq | EXTENDED_RELEASE_TABLET | Freq: Two times a day (BID) | ORAL | Status: AC
  Administered 2018-08-09 (×2): 20 meq via ORAL
  Filled 2018-08-09 (×2): qty 1

## 2018-08-09 NOTE — Plan of Care (Signed)
  Problem: Pain Managment: Goal: General experience of comfort will improve Outcome: Progressing   Problem: Safety: Goal: Ability to remain free from injury will improve Outcome: Progressing   Problem: Skin Integrity: Goal: Risk for impaired skin integrity will decrease Outcome: Progressing   

## 2018-08-09 NOTE — Consult Note (Signed)
Medical Consultation   April Wells  ZOX:[id number]  DOB: December 11, 1985  DOA: 08/08/2018  PCP: Patient, No Pcp Per   Outpatient Specialists: None   Requesting physician: [facility]/Rayburn - surgery  Reason for consultation: Medical consultation, ?assumption of care.     History of Present Illness: April Childiffany Kushner is an 33 y.o. female with h/o HSV who presented yesterday with n/v/d.  She was admitted to the surgery service for presumed cholecystitis with plan for lap chole this AM.  She has continued to have n/v/d and appears to have more of a colitis picture than a surgical problem.  Data reviewed:  I have personally reviewed the recent labs and imaging studies  Pertinent Labs:   K+ 3.0 Glucose 160 WBC 10.9; 20.9 on 6/24 MRSA negative Upreg negative COVID negative   Radiological Exams on Admission: Ct Abdomen Pelvis W Contrast  Result Date: 08/08/2018 CLINICAL DATA:  Right upper quadrant pain with nausea and vomiting EXAM: CT ABDOMEN AND PELVIS WITH CONTRAST TECHNIQUE: Multidetector CT imaging of the abdomen and pelvis was performed using the standard protocol following bolus administration of intravenous contrast. CONTRAST:  100mL OMNIPAQUE IOHEXOL 300 MG/ML  SOLN COMPARISON:  None. FINDINGS: Lower chest: Lung bases are clear. Hepatobiliary: No focal liver lesions are demonstrable. There is an apparent nitrogen containing gallstone within the gallbladder. Gallbladder wall does not appear appreciably thickened. There is no biliary duct dilatation. Pancreas: There is no pancreatic mass or inflammatory focus. Spleen: No splenic lesions are evident. Adrenals/Urinary Tract: Adrenals bilaterally appear unremarkable. Kidneys bilaterally show no evident mass or hydronephrosis on either side. There is a nonobstructing 3 mm calculus in the lower pole the right kidney. There is a 1 mm calculus in the lower pole left kidney. There is no demonstrable ureteral calculus on either side.  Urinary bladder is midline with wall thickness within normal limits. Stomach/Bowel: There is fluid throughout much of the colon, particularly in the right colonic region. There is no appreciable bowel wall or mesenteric thickening. There is no evident bowel obstruction. Terminal ileum appears within normal limits. There is no demonstrable free air or portal venous air. Vascular/Lymphatic: There is no abdominal aortic aneurysm. No vascular lesions are evident. There is no adenopathy appreciable in the abdomen or pelvis. Reproductive: Uterus is anteverted.  No evident pelvic mass. Other: No appendiceal lesions evident. Appendix appears normal in size and contour. No evident abscess or ascites in the abdomen or pelvis. Musculoskeletal: There are no blastic or lytic bone lesions. There is no intramuscular or abdominal wall lesion appreciable. IMPRESSION: 1. Suspect a degree of right-sided colitis given fluid throughout the right colon region. No appreciable bowel wall thickening, however. No mesenteric stranding. No evident bowel obstruction. No changes indicative of bowel ischemia. 2. Appendix appears unremarkable. No abscess evident abdomen or pelvis. 3. Small nonobstructing calculus in each kidney. No hydronephrosis or ureteral calculus on either side. Urinary bladder wall thickness within normal limits. 4. Nitrogen containing gallstone noted. Gallbladder wall does not appear appreciably thickened. Electronically Signed   By: Bretta BangWilliam  Woodruff III M.D.   On: 08/08/2018 15:34   [place] Abdomen Limited Ruq  Result Date: 08/08/2018 CLINICAL DATA:  Right upper quadrant pain, nausea and vomiting for 3 days. EXAM: ULTRASOUND ABDOMEN LIMITED RIGHT UPPER QUADRANT COMPARISON:  None. FINDINGS: Gallbladder: Stones are identified in the gallbladder measuring up to 1.6 cm in diameter. There is no gallbladder wall thickening or pericholecystic fluid. Sonographer reports  negative Murphy's sign. Common bile duct: Diameter: 0.2 cm  Liver: No focal lesion identified. Within normal limits in parenchymal echogenicity. Portal vein is patent on color Doppler imaging with normal direction of blood flow towards the liver. IMPRESSION: Gallstones without evidence of cholecystitis. Electronically Signed   By: Inge Rise M.D.   On: 08/08/2018 12:45    Impression/Recommendations Active Problems:   Abdominal pain   I have not actually seen this patient.  Based on my review of the chart, she is young and healthy and appears to have a colitis.  She is hemodynamically stable and does not appear to need ongoing hospitalization at this time.  I would recommend K+ supplementation; stool studies; PO Cipro/Flagyl; and close outpatient follow-up.  I offered to see the patient and potentially accept the patient in transfer, but after my discussion with PA Rayburn, she is satisfied with the recommendations offered at this time and declines formal medical consultation.    Karmen Bongo M.D. [facility] 08/09/2018, 9:47 AM

## 2018-08-09 NOTE — Progress Notes (Addendum)
[facility] Progress Note     Subjective: CC: abdominal pain and diarrhea Patient still having R sided abdominal pain, lower this AM. Having some watery diarrhea. Vomited 2x this AM, small amount, non-bloody, non-bilious. No nausea currently.   Objective: Vital signs in last 24 hours: Temp:  [98.3 F (36.8 C)-98.6 F (37 C)] 98.4 F (36.9 C) (06/25 0535) Pulse Rate:  [77-94] 83 (06/25 0535) Resp:  [14-20] 18 (06/25 0535) BP: (116-142)/(73-90) 124/80 (06/25 0535) SpO2:  [96 %-100 %] 100 % (06/25 0535) Weight:  [106 kg] 106 kg (06/24 0958) Last BM Date: 08/07/18  Intake/Output from previous day: 06/24 0701 - 06/25 0700 In: 1685.4 [P.O.:480; I.V.:1131.2; IV Piggyback:74.3] Out: -  Intake/Output this shift: No intake/output data recorded.  PE: Gen:  Alert, NAD, pleasant Card:  Regular rate and rhythm Pulm:  Normal effort, clear to auscultation bilaterally Abd: Soft, TTP in R abdomen, no rebound tenderness or guarding, non-distended, +BS Skin: warm and dry, no rashes  Psych: A&Ox3   Lab Results:  Recent Labs    08/08/18 1004 08/09/18 0146  WBC 20.9* 10.9*  HGB 14.0 12.2  HCT 40.1 34.8*  PLT 201 188   BMET Recent Labs    08/08/18 1004 08/09/18 0146  NA 140 139  K 3.6 3.0*  CL 106 104  CO2 20* 25  GLUCOSE 150* 160*  BUN 8 7  CREATININE 0.97 0.96  CALCIUM 10.0 9.4   PT/INR No results for input(s): LABPROT, INR in the last 72 hours. CMP     Component Value Date/Time   NA 139 08/09/2018 0146   K 3.0 (L) 08/09/2018 0146   CL 104 08/09/2018 0146   CO2 25 08/09/2018 0146   GLUCOSE 160 (H) 08/09/2018 0146   BUN 7 08/09/2018 0146   CREATININE 0.96 08/09/2018 0146   CALCIUM 9.4 08/09/2018 0146   PROT 7.8 08/09/2018 0146   ALBUMIN 3.6 08/09/2018 0146   AST 14 (L) 08/09/2018 0146   ALT 23 08/09/2018 0146   ALKPHOS 59 08/09/2018 0146   BILITOT 0.7 08/09/2018 0146   GFRNONAA >60 08/09/2018 0146   GFRAA >60 08/09/2018 0146   Lipase      Component Value Date/Time   LIPASE 23 08/08/2018 1004       Studies/Results: Ct Abdomen Pelvis W Contrast  Result Date: 08/08/2018 CLINICAL DATA:  Right upper quadrant pain with nausea and vomiting EXAM: CT ABDOMEN AND PELVIS WITH CONTRAST TECHNIQUE: Multidetector CT imaging of the abdomen and pelvis was performed using the standard protocol following bolus administration of intravenous contrast. CONTRAST:  100mL OMNIPAQUE IOHEXOL 300 MG/ML  SOLN COMPARISON:  None. FINDINGS: Lower chest: Lung bases are clear. Hepatobiliary: No focal liver lesions are demonstrable. There is an apparent nitrogen containing gallstone within the gallbladder. Gallbladder wall does not appear appreciably thickened. There is no biliary duct dilatation. Pancreas: There is no pancreatic mass or inflammatory focus. Spleen: No splenic lesions are evident. Adrenals/Urinary Tract: Adrenals bilaterally appear unremarkable. Kidneys bilaterally show no evident mass or hydronephrosis on either side. There is a nonobstructing 3 mm calculus in the lower pole the right kidney. There is a 1 mm calculus in the lower pole left kidney. There is no demonstrable ureteral calculus on either side. Urinary bladder is midline with wall thickness within normal limits. Stomach/Bowel: There is fluid throughout much of the colon, particularly in the right colonic region. There is no appreciable bowel wall or mesenteric thickening. There is no evident bowel obstruction. Terminal ileum appears within normal  limits. There is no demonstrable free air or portal venous air. Vascular/Lymphatic: There is no abdominal aortic aneurysm. No vascular lesions are evident. There is no adenopathy appreciable in the abdomen or pelvis. Reproductive: Uterus is anteverted.  No evident pelvic mass. Other: No appendiceal lesions evident. Appendix appears normal in size and contour. No evident abscess or ascites in the abdomen or pelvis. Musculoskeletal: There are no blastic  or lytic bone lesions. There is no intramuscular or abdominal wall lesion appreciable. IMPRESSION: 1. Suspect a degree of right-sided colitis given fluid throughout the right colon region. No appreciable bowel wall thickening, however. No mesenteric stranding. No evident bowel obstruction. No changes indicative of bowel ischemia. 2. Appendix appears unremarkable. No abscess evident abdomen or pelvis. 3. Small nonobstructing calculus in each kidney. No hydronephrosis or ureteral calculus on either side. Urinary bladder wall thickness within normal limits. 4. Nitrogen containing gallstone noted. Gallbladder wall does not appear appreciably thickened. Electronically Signed   By: Lowella Grip III M.D.   On: 08/08/2018 15:34   US Abdomen Limited Ruq  Result Date: 08/08/2018 CLINICAL DATA:  Right upper quadrant pain, nausea and vomiting for 3 days. EXAM: ULTRASOUND ABDOMEN LIMITED RIGHT UPPER QUADRANT COMPARISON:  None. FINDINGS: Gallbladder: Stones are identified in the gallbladder measuring up to 1.6 cm in diameter. There is no gallbladder wall thickening or pericholecystic fluid. Sonographer reports negative Murphy's sign. Common bile duct: Diameter: 0.2 cm Liver: No focal lesion identified. Within normal limits in parenchymal echogenicity. Portal vein is patent on color Doppler imaging with normal direction of blood flow towards the liver. IMPRESSION: Gallstones without evidence of cholecystitis. Electronically Signed   By: Inge Rise M.D.   On: 08/08/2018 12:45    Anti-infectives: Anti-infectives (From admission, onward)   Start     Dose/Rate Route Frequency Ordered Stop   08/08/18 1400  piperacillin-tazobactam (ZOSYN) IVPB 3.375 g     3.375 g 12.5 mL/hr over 240 Minutes Intravenous Every 8 hours 08/08/18 1357         Assessment/Plan Cholelithiasis - no gallbladder wall thickening or pericholecystic fluid, LFTs normal, I think this is less likely to be source of pain Colitis- seen in R  colon on CT yesterday - pt having watery diarrhea this AM - check GI panel and C.Diff for infectious causes - continue abx  - CLD Leukocytosis - WBC 10 from 20, afeb Hypokalemia - K 3.0 this AM, likely from GI losses, replace IV/PO  FEN: CLD, IVF VTE: SCDs, lovenox ID: Zosyn 6/24>>  Feel abdominal pain is more likely related to colitis rather than cholelithiasis given diarrhea and lower abdominal pain today. Will check stool for infectious source.   Will also recheck this afternoon - patient could likely go home on PO abx   LOS: 0 days    Brigid Re , [facility] 08/09/2018, 9:11 AM Pager: [phone] Consults: [phone]

## 2018-08-10 LAB — CBC
HCT: 33.2 % — ABNORMAL LOW (ref 36.0–46.0)
Hemoglobin: 11.5 g/dL — ABNORMAL LOW (ref 12.0–15.0)
MCH: 29.6 pg (ref 26.0–34.0)
MCHC: 34.6 g/dL (ref 30.0–36.0)
MCV: 85.6 fL (ref 80.0–100.0)
Platelets: 206 10*3/uL (ref 150–400)
RBC: 3.88 MIL/uL (ref 3.87–5.11)
RDW: 13 % (ref 11.5–15.5)
WBC: 6.4 10*3/uL (ref 4.0–10.5)
nRBC: 0 % (ref 0.0–0.2)

## 2018-08-10 LAB — GASTROINTESTINAL PANEL BY PCR, STOOL (REPLACES STOOL CULTURE)

## 2018-08-10 LAB — BASIC METABOLIC PANEL
Anion gap: 8 (ref 5–15)
BUN: 8 mg/dL (ref 6–20)
CO2: 23 mmol/L (ref 22–32)
Calcium: 8.9 mg/dL (ref 8.9–10.3)
Chloride: 107 mmol/L (ref 98–111)
Creatinine, Ser: 0.87 mg/dL (ref 0.44–1.00)
GFR calc Af Amer: >60 mL/min (ref >60)
GFR calc non Af Amer: >60 mL/min (ref >60)
Glucose, Bld: 125 mg/dL — ABNORMAL HIGH (ref 70–99)
Potassium: 3.2 mmol/L — ABNORMAL LOW (ref 3.5–5.1)
Sodium: 138 mmol/L (ref 135–145)

## 2018-08-10 MED ORDER — OXYCODONE HCL 5 MG PO TABS: 5.0000 mg | ORAL_TABLET | Freq: Four times a day (QID) | ORAL | 0 refills | Status: DC | PRN

## 2018-08-10 MED ORDER — CIPROFLOXACIN HCL 500 MG PO TABS: 500.0000 mg | ORAL_TABLET | Freq: Two times a day (BID) | ORAL | 0 refills | Status: DC

## 2018-08-10 MED ORDER — IBUPROFEN 200 MG PO TABS: 400.0000 mg | ORAL_TABLET | Freq: Four times a day (QID) | ORAL | Status: DC | PRN

## 2018-08-10 MED ORDER — ACETAMINOPHEN 325 MG PO TABS: 650.0000 mg | ORAL_TABLET | Freq: Four times a day (QID) | ORAL | Status: DC | PRN

## 2018-08-10 MED ORDER — METRONIDAZOLE 500 MG PO TABS: 500.0000 mg | ORAL_TABLET | Freq: Two times a day (BID) | ORAL | 0 refills | Status: DC

## 2018-08-10 MED ORDER — ONDANSETRON 4 MG PO TBDP: 4.0000 mg | ORAL_TABLET | Freq: Four times a day (QID) | ORAL | 0 refills | Status: DC | PRN

## 2018-08-10 NOTE — Discharge Summary (Signed)
[facility] Discharge Summary   Patient ID: April Wells MRN: [id number] DOB/AGE: [birth year] 33 y.o.  Admit date: 08/08/2018 Discharge date: 08/10/2018  Admitting Diagnosis: Abdominal pain   Discharge Diagnosis Colitis  Consultants Internal medicine   Imaging: Ct Abdomen Pelvis W Contrast  Result Date: 08/08/2018 CLINICAL DATA:  Right upper quadrant pain with nausea and vomiting EXAM: CT ABDOMEN AND PELVIS WITH CONTRAST TECHNIQUE: Multidetector CT imaging of the abdomen and pelvis was performed using the standard protocol following bolus administration of intravenous contrast. CONTRAST:  100mL OMNIPAQUE IOHEXOL 300 MG/ML  SOLN COMPARISON:  None. FINDINGS: Lower chest: Lung bases are clear. Hepatobiliary: No focal liver lesions are demonstrable. There is an apparent nitrogen containing gallstone within the gallbladder. Gallbladder wall does not appear appreciably thickened. There is no biliary duct dilatation. Pancreas: There is no pancreatic mass or inflammatory focus. Spleen: No splenic lesions are evident. Adrenals/Urinary Tract: Adrenals bilaterally appear unremarkable. Kidneys bilaterally show no evident mass or hydronephrosis on either side. There is a nonobstructing 3 mm calculus in the lower pole the right kidney. There is a 1 mm calculus in the lower pole left kidney. There is no demonstrable ureteral calculus on either side. Urinary bladder is midline with wall thickness within normal limits. Stomach/Bowel: There is fluid throughout much of the colon, particularly in the right colonic region. There is no appreciable bowel wall or mesenteric thickening. There is no evident bowel obstruction. Terminal ileum appears within normal limits. There is no demonstrable free air or portal venous air. Vascular/Lymphatic: There is no abdominal aortic aneurysm. No vascular lesions are evident. There is no adenopathy appreciable in the abdomen or pelvis. Reproductive: Uterus is anteverted.   No evident pelvic mass. Other: No appendiceal lesions evident. Appendix appears normal in size and contour. No evident abscess or ascites in the abdomen or pelvis. Musculoskeletal: There are no blastic or lytic bone lesions. There is no intramuscular or abdominal wall lesion appreciable. IMPRESSION: 1. Suspect a degree of right-sided colitis given fluid throughout the right colon region. No appreciable bowel wall thickening, however. No mesenteric stranding. No evident bowel obstruction. No changes indicative of bowel ischemia. 2. Appendix appears unremarkable. No abscess evident abdomen or pelvis. 3. Small nonobstructing calculus in each kidney. No hydronephrosis or ureteral calculus on either side. Urinary bladder wall thickness within normal limits. 4. Nitrogen containing gallstone noted. Gallbladder wall does not appear appreciably thickened. Electronically Signed   By: Bretta BangWilliam  Woodruff III M.D.   On: 08/08/2018 15:34   [place] Abdomen Limited Ruq  Result Date: 08/08/2018 CLINICAL DATA:  Right upper quadrant pain, nausea and vomiting for 3 days. EXAM: ULTRASOUND ABDOMEN LIMITED RIGHT UPPER QUADRANT COMPARISON:  None. FINDINGS: Gallbladder: Stones are identified in the gallbladder measuring up to 1.6 cm in diameter. There is no gallbladder wall thickening or pericholecystic fluid. Sonographer reports negative Murphy's sign. Common bile duct: Diameter: 0.2 cm Liver: No focal lesion identified. Within normal limits in parenchymal echogenicity. Portal vein is patent on color Doppler imaging with normal direction of blood flow towards the liver. IMPRESSION: Gallstones without evidence of cholecystitis. Electronically Signed   By: Drusilla Kannerhomas  Dalessio M.D.   On: 08/08/2018 12:45    Procedures None  Hospital Course:  Patient is an otherwise healthy 33 year old female who [facility] with abdominal pain with leukocytosis.  Workup showed cholelithiasis initially but no signs of cholecystitis on  imaging.  Patient was admitted and underwent CT scan to rule out other abdominal sources of  infection. CT scan showed mild right sided colitis. Stool samples sent and were negative for C.diff. GI panel is still pending. Patient improved with antibiotics and diet was advanced as tolerated.  On 08/10/18, the patient was voiding well, tolerating diet, ambulating well, pain well controlled, vital signs stable and felt stable for discharge home.  Patient will follow up with primary care in 1-2 weeks. She was also given information on cholelithiasis and what to watch for with gallbladder disease. She has been given [facility] information if elective cholecystectomy needed in the future.   Physical Exam: Gen:  Alert, NAD, pleasant Card:  Regular rate and rhythm Pulm:  Normal effort, clear to auscultation bilaterally Abd: Soft, TTP in R abdomen, no rebound tenderness or guarding, non-distended, +BS Skin: warm and dry, no rashes  Psych: A&Ox3   Allergies as of 08/10/2018   No Known Allergies     Medication List    TAKE these medications   acetaminophen 325 MG tablet Commonly known as: TYLENOL Take 2 tablets (650 mg total) by mouth every 6 (six) hours as needed for mild pain or fever (or temp > 100).   ciprofloxacin 500 MG tablet Commonly known as: Cipro Take 1 tablet (500 mg total) by mouth 2 (two) times daily.   ibuprofen 200 MG tablet Commonly known as: ADVIL Take 2 tablets (400 mg total) by mouth every 6 (six) hours as needed for moderate pain.   medroxyPROGESTERone 150 MG/ML injection Commonly known as: DEPO-PROVERA Inject 150 mg into the muscle every 3 (three) months.   metroNIDAZOLE 500 MG tablet Commonly known as: Flagyl Take 1 tablet (500 mg total) by mouth 2 (two) times daily with a meal. DO NOT CONSUME ALCOHOL WHILE TAKING THIS MEDICATION.   ondansetron 4 MG disintegrating tablet Commonly known as: ZOFRAN-ODT Take 1 tablet (4 mg total) by mouth every 6 (six) hours as needed for  nausea.   oxyCODONE 5 MG immediate release tablet Commonly known as: Oxy IR/ROXICODONE Take 1-2 tablets (5-10 mg total) by mouth every 6 (six) hours as needed for severe pain.   phentermine 37.5 MG tablet Commonly known as: ADIPEX-P Take 1 tablet by mouth daily.   topiramate 50 MG tablet Commonly known as: TOPAMAX Take 50 mg by mouth 2 (two) times a day.        Follow-up Information    Surgery, [place]. Call.   Specialty: General Surgery Why: Call as needed if you develop symptoms from gallstones.  Contact information: [place] [address] [phone]        Charleen Kirks, MD. Call.   Specialty: Internal Medicine Why: Call and schedule an appointment in 1-2 weeks for follow up of colitis. Contact information: [place] [address] [phone]           Signed: Brigid Re, [facility] 08/10/2018, 9:11 AM Pager: [phone] Consults: [phone]

## 2018-08-10 NOTE — Discharge Instructions (Signed)
It is normal for your appetite to be decreased for a few days still. Focus on staying hydrated - thing like gatorade and pedialyte to keep your electrolytes in balance are good.   Eat a bland diet for the next few days. BRAT diet (bananas, rice, applesauce, toast) can be helpful if you are still having diarrhea. You can start advancing your diet back to your usual foods as you feel better.     Colitis  Colitis is inflammation of the colon. Colitis may last a short time (be acute), or it may last a long time (become chronic). What are the causes? This condition may be caused by:  Viruses.  Bacteria.  Reaction to medicine.  Certain autoimmune diseases such as Crohn's disease or ulcerative colitis.  Radiation treatment.  Decreased blood flow to the bowel (ischemia). What are the signs or symptoms? Symptoms of this condition include:  Watery diarrhea.  Passing bloody or tarry stool.  Pain.  Fever.  Vomiting.  Tiredness (fatigue).  Weight loss.  Bloating.  Abdominal pain.  Having fewer bowel movements than usual.  A strong and sudden urge to have a bowel movement.  Feeling like the bowel is not empty after a bowel movement. How is this diagnosed? This condition is diagnosed with a stool test or a blood test. You may also have other tests, such as:  X-rays.  CT scan.  Colonoscopy.  Endoscopy.  Biopsy. How is this treated? Treatment for this condition depends on the cause. The condition may be treated by:  Resting the bowel. This involves not eating or drinking for a period of time.  Fluids that are given through an IV.  Medicine for pain and diarrhea.  Antibiotic medicines.  Cortisone medicines.  Surgery. Follow these instructions at home: Eating and drinking   Follow instructions from your health care provider about eating or drinking restrictions.  Drink enough fluid to keep your urine pale yellow.  Work with a dietitian to determine  which foods cause your condition to flare up.  Avoid foods that cause flare-ups.  Eat a well-balanced diet. General instructions  If you were prescribed an antibiotic medicine, take it as told by your health care provider. Do not stop taking the antibiotic even if you start to feel better.  Take over-the-counter and prescription medicines only as told by your health care provider.  Keep all follow-up visits as told by your health care provider. This is important. Contact a health care provider if:  Your symptoms do not go away.  You develop new symptoms. Get help right away if you:  Have a fever that does not go away with treatment.  Develop chills.  Have extreme weakness, fainting, or dehydration.  Have repeated vomiting.  Develop severe pain in your abdomen.  Pass bloody or tarry stool. Summary  Colitis is inflammation of the colon. Colitis may last a short time (be acute), or it may last a long time (become chronic).  Treatment for this condition depends on the cause and may include resting the bowel, taking medicines, or having surgery.  If you were prescribed an antibiotic medicine, take it as told by your health care provider. Do not stop taking the antibiotic even if you start to feel better.  Get help right away if you develop severe pain in your abdomen.  Keep all follow-up visits as told by your health care provider. This is important. This information is not intended to replace advice given to you by your health care provider.  Make sure you discuss any questions you have with your health care provider. Document Released: 03/10/2004 Document Revised: 08/03/2017 Document Reviewed: 08/03/2017 Elsevier Interactive Patient Education  2019 [employer].   Cholelithiasis  Cholelithiasis is also called "gallstones." It is a kind of gallbladder disease. The gallbladder is an organ that stores a liquid (bile) that helps you digest fat. Gallstones may not cause  symptoms (may be silent gallstones) until they cause a blockage, and then they can cause pain (gallbladder attack). Follow these instructions at home:  Take over-the-counter and prescription medicines only as told by your doctor.  Stay at a healthy weight.  Eat healthy foods. This includes: ? Eating fewer fatty foods, like fried foods. ? Eating fewer refined carbs (refined carbohydrates). Refined carbs are breads and grains that are highly processed, like white bread and white rice. Instead, choose whole grains like whole-wheat bread and brown rice. ? Eating more fiber. Almonds, fresh fruit, and beans are healthy sources of fiber.  Keep all follow-up visits as told by your doctor. This is important. Contact a doctor if:  You have sudden pain in the upper right side of your belly (abdomen). Pain might spread to your right shoulder or your chest. This may be a sign of a gallbladder attack.  You feel sick to your stomach (are nauseous).  You throw up (vomit).  You have been diagnosed with gallstones that have no symptoms and you get: ? Belly pain. ? Discomfort, burning, or fullness in the upper part of your belly (indigestion). Get help right away if:  You have sudden pain in the upper right side of your belly, and it lasts for more than 2 hours.  You have belly pain that lasts for more than 5 hours.  You have a fever or chills.  You keep feeling sick to your stomach or you keep throwing up.  Your skin or the whites of your eyes turn yellow (jaundice).  You have dark-colored pee (urine).  You have light-colored poop (stool). Summary  Cholelithiasis is also called "gallstones."  The gallbladder is an organ that stores a liquid (bile) that helps you digest fat.  Silent gallstones are gallstones that do not cause symptoms.  A gallbladder attack may cause sudden pain in the upper right side of your belly. Pain might spread to your right shoulder or your chest. If this happens,  contact your doctor.  If you have sudden pain in the upper right side of your belly that lasts for more than 2 hours, get help right away. This information is not intended to replace advice given to you by your health care provider. Make sure you discuss any questions you have with your health care provider. Document Released: 07/20/2007 Document Revised: 10/18/2015 Document Reviewed: 10/18/2015 Elsevier Interactive Patient Education  2019 [employer].

## 2018-08-10 NOTE — Progress Notes (Signed)
Pt for discharged today going home, pt alert and oriented, ambulatory and eats well, no complain of any pain at this time, given all her personal belongings, health teachings, next appointment, due med explained and understood, discontinued peripheral IV line, waiting for her family to pick her up.

## 2018-08-10 NOTE — Progress Notes (Signed)
Pt discharge home, no s/s of distress., pick up by her sister assisted by the CNA on duty.

## 2018-08-10 NOTE — Progress Notes (Signed)
Pt c-diff negative and also GI panel, infection control aware, discontinued enteric precaution.

## 2019-02-16 ENCOUNTER — Other Ambulatory Visit: Payer: Self-pay

## 2019-02-16 ENCOUNTER — Encounter: Payer: Self-pay | Admitting: Emergency Medicine

## 2019-02-16 ENCOUNTER — Ambulatory Visit
Admission: EM | Admit: 2019-02-16 | Discharge: 2019-02-16 | Disposition: A | Discharge status: Home / Self Care | Payer: Commercial Managed Care - PPO | Attending: Emergency Medicine | Admitting: Emergency Medicine

## 2019-02-16 DIAGNOSIS — B9789 Other viral agents as the cause of diseases classified elsewhere: Secondary | ICD-10-CM | POA: Diagnosis not present

## 2019-02-16 DIAGNOSIS — J0100 Acute maxillary sinusitis, unspecified: Secondary | ICD-10-CM

## 2019-02-16 DIAGNOSIS — R0981 Nasal congestion: Secondary | ICD-10-CM

## 2019-02-16 MED ORDER — AZITHROMYCIN 250 MG PO TABS: 250.0000 mg | ORAL_TABLET | Freq: Every day | ORAL | 0 refills | Status: AC

## 2019-02-16 NOTE — ED Notes (Signed)
Patient able to ambulate independently  

## 2019-02-16 NOTE — Discharge Instructions (Signed)
Take antibiotic as prescribed. Return for worsening pain, fever, cough, difficulty breathing.

## 2019-02-16 NOTE — ED Provider Notes (Signed)
[facility]    CSN: [id number] Arrival date & time: 02/16/19  0930      History   Chief Complaint Chief Complaint  Patient presents with  . APPT: 930am  . URI    HPI April Wells is a 34 y.o. female presenting for presenting for 6-day course of nasal congestion, dry cough, sinus pressure, generalized headache.  Endorsing loss of smell since Wednesday.  Has been taking Mucinex without relief of symptoms.  Denied fever, shortness of breath, chest pain.  Does endorse history of sinus infections, states this feels similar.  Of note, patient underwent negative Covid PCR testing 3 days ago.  Denies known sick contacts.  Past Medical History:  Diagnosis Date  . Abdominal pain   . Herpes genitalia     Patient Active Problem List   Diagnosis Date Noted  . Abdominal pain 08/08/2018    Past Surgical History:  Procedure Laterality Date  . NO PAST SURGERIES    . WISDOM TOOTH EXTRACTION      OB History   No obstetric history on file.      Home Medications    Prior to Admission medications   Medication Sig Start Date End Date Taking? Authorizing Provider  azithromycin (ZITHROMAX) 250 MG tablet Take 1 tablet (250 mg total) by mouth daily. Take first 2 tablets together, then 1 every day until finished. 02/16/19   Hall-Potvin, [place], PA-C  ibuprofen (ADVIL) 200 MG tablet Take 2 tablets (400 mg total) by mouth every 6 (six) hours as needed for moderate pain. 08/10/18   Rayburn, Alphonsus Sias, PA-C  medroxyPROGESTERone (DEPO-PROVERA) 150 MG/ML injection Inject 150 mg into the muscle every 3 (three) months.    [provider]  phentermine (ADIPEX-P) 37.5 MG tablet Take 1 tablet by mouth daily. 07/11/18 08/10/18  [provider]  topiramate (TOPAMAX) 50 MG tablet Take 50 mg by mouth 2 (two) times a day. 07/04/18 07/04/19  [provider]    Family History Family History  Problem Relation Age of Onset  . Healthy Mother   . Healthy Father     Social  History Social History   Tobacco Use  . Smoking status: Never Smoker  . Smokeless tobacco: Never Used  Substance Use Topics  . Alcohol use: Yes  . Drug use: No     Allergies   Patient has no known allergies.   Review of Systems Review of Systems  Constitutional: Negative for activity change, appetite change, fatigue and fever.  HENT: Positive for congestion, sinus pressure and sinus pain. Negative for dental problem, ear pain, facial swelling, hearing loss, sore throat, trouble swallowing and voice change.   Eyes: Negative for photophobia, pain and visual disturbance.  Respiratory: Positive for cough. Negative for choking, chest tightness, shortness of breath, wheezing and stridor.   Cardiovascular: Negative for chest pain and palpitations.  Gastrointestinal: Negative for diarrhea and vomiting.  Musculoskeletal: Negative for arthralgias and myalgias.  Neurological: Positive for headaches. Negative for dizziness, weakness and light-headedness.     Physical Exam Triage Vital Signs ED Triage Vitals  Enc Vitals Group     BP 02/16/19 1000 130/79     Pulse Rate 02/16/19 1000 84     Resp 02/16/19 1000 16     Temp 02/16/19 1000 (!) 97.4 F (36.3 C)     Temp Source 02/16/19 1000 Temporal     SpO2 02/16/19 1000 99 %     Weight --      Height --  Head Circumference --      Peak Flow --      Pain Score 02/16/19 1002 5     Pain Loc --      Pain Edu? --      Excl. in [place]? --    No data found.  Updated Vital Signs BP 130/79 (BP Location: Right Arm)   Pulse 84   Temp (!) 97.4 F (36.3 C) (Temporal)   Resp 16   SpO2 99%   Visual Acuity Right Eye Distance:   Left Eye Distance:   Bilateral Distance:    Right Eye Near:   Left Eye Near:    Bilateral Near:     Physical Exam Constitutional:      General: She is not in acute distress. HENT:     Head: Normocephalic and atraumatic.     Jaw: There is normal jaw occlusion. No tenderness or pain on movement.     Right  Ear: Hearing, tympanic membrane, ear canal and external ear normal. No tenderness. No mastoid tenderness.     Left Ear: Hearing, tympanic membrane, ear canal and external ear normal. No tenderness. No mastoid tenderness.     Nose: Congestion present. No nasal deformity, septal deviation or nasal tenderness.     Right Turbinates: Not swollen or pale.     Left Turbinates: Not swollen or pale.     Right Sinus: No maxillary sinus tenderness or frontal sinus tenderness.     Left Sinus: No maxillary sinus tenderness or frontal sinus tenderness.     Comments: Maxillary sinus tenderness bilaterally    Mouth/Throat:     Lips: Pink. No lesions.     Mouth: Mucous membranes are moist. No injury.     Pharynx: Oropharynx is clear. Uvula midline. No posterior oropharyngeal erythema or uvula swelling.     Comments: no tonsillar exudate or hypertrophy Eyes:     General: No scleral icterus.    Conjunctiva/sclera: Conjunctivae normal.     Pupils: Pupils are equal, round, and reactive to light.  Cardiovascular:     Rate and Rhythm: Normal rate and regular rhythm.  Pulmonary:     Effort: Pulmonary effort is normal. No respiratory distress.     Breath sounds: No wheezing.  Musculoskeletal:     Cervical back: Normal range of motion and neck supple. Tenderness present. No muscular tenderness.  Lymphadenopathy:     Cervical: Cervical adenopathy present.  Skin:    Capillary Refill: Capillary refill takes less than 2 seconds.  Neurological:     General: No focal deficit present.     Mental Status: She is alert and oriented to person, place, and time.      UC Treatments / Results  Labs (all labs ordered are listed, but only abnormal results are displayed) Labs Reviewed - No data to display  EKG   Radiology No results found.  Procedures Procedures (including critical care time)  Medications Ordered in UC Medications - No data to display  Initial Impression / Assessment and Plan / UC Course  I  have reviewed the triage vital signs and the nursing notes.  Pertinent labs & imaging results that were available during my care of the patient were reviewed by me and considered in my medical decision making (see chart for details).     Final Clinical Impressions(s) / UC Diagnoses   Final diagnoses:  Acute non-recurrent maxillary sinusitis     Discharge Instructions     Take antibiotic as prescribed. Return for worsening  pain, fever, cough, difficulty breathing.    ED Prescriptions    Medication Sig Dispense Auth. Provider   azithromycin (ZITHROMAX) 250 MG tablet Take 1 tablet (250 mg total) by mouth daily. Take first 2 tablets together, then 1 every day until finished. 6 tablet Hall-Potvin, [place], PA-C     PDMP not reviewed this encounter.   Odette Fraction [place], [place] 02/17/19 1901

## 2019-02-16 NOTE — ED Triage Notes (Addendum)
Pt presents to [facility] fror assessment of 6 days of nasal congestion, cough, loss of taste and smell, pressure headache.  Pt has tried multiple OTC remedies without relief.  Pt tried mucinex sinus max without relief.  COVID test Wednesday, negative result thursday

## 2019-03-05 ENCOUNTER — Other Ambulatory Visit: Payer: Self-pay | Admitting: *Deleted

## 2019-03-05 DIAGNOSIS — Z20822 Contact with and (suspected) exposure to covid-19: Secondary | ICD-10-CM

## 2019-03-06 LAB — NOVEL CORONAVIRUS, NAA: SARS-CoV-2, NAA: NOT DETECTED

## 2019-03-16 ENCOUNTER — Other Ambulatory Visit: Payer: Self-pay | Admitting: *Deleted

## 2019-03-16 DIAGNOSIS — Z20822 Contact with and (suspected) exposure to covid-19: Secondary | ICD-10-CM

## 2019-03-17 LAB — NOVEL CORONAVIRUS, NAA: SARS-CoV-2, NAA: NOT DETECTED

## 2019-04-08 ENCOUNTER — Other Ambulatory Visit: Payer: Self-pay

## 2019-04-08 DIAGNOSIS — Z20822 Contact with and (suspected) exposure to covid-19: Secondary | ICD-10-CM

## 2019-04-09 LAB — NOVEL CORONAVIRUS, NAA: SARS-CoV-2, NAA: NOT DETECTED

## 2019-07-13 ENCOUNTER — Ambulatory Visit: Payer: Commercial Managed Care - PPO | Attending: Internal Medicine

## 2019-07-13 DIAGNOSIS — Z23 Encounter for immunization: Secondary | ICD-10-CM

## 2019-07-13 NOTE — Progress Notes (Signed)
   Covid-19 Vaccination Clinic  Name:  April Wells    MRN: [id number] DOB: April 16, 1985  07/13/2019  [name] was observed post Covid-19 immunization for 15 minutes without incident. She was provided with Vaccine Information Sheet and instruction to access the V-Safe system.   [name] was instructed to call 911 with any severe reactions post vaccine: Marland Kitchen Difficulty breathing  . Swelling of face and throat  . A fast heartbeat  . A bad rash all over body  . Dizziness and weakness   Immunizations Administered    Name Date Dose VIS Date Route   Pfizer COVID-19 Vaccine 07/13/2019 11:00 AM 0.3 mL 04/10/2018 Intramuscular   Manufacturer: [employer], [employer]   Lot: [id number]   NDC: [address]-5

## 2019-08-05 ENCOUNTER — Ambulatory Visit: Payer: Commercial Managed Care - PPO | Attending: Internal Medicine

## 2019-08-05 DIAGNOSIS — Z23 Encounter for immunization: Secondary | ICD-10-CM

## 2019-08-05 NOTE — Progress Notes (Signed)
   Covid-19 Vaccination Clinic  Name:  Jordyan Hardiman    MRN: [id number] DOB: [birth year]  08/05/2019  Ms. Nevils was observed post Covid-19 immunization for 15 minutes without incident. She was provided with Vaccine Information Sheet and instruction to access the V-Safe system.   Ms. Funchess was instructed to call 911 with any severe reactions post vaccine: Marland Kitchen Difficulty breathing  . Swelling of face and throat  . A fast heartbeat  . A bad rash all over body  . Dizziness and weakness   Immunizations Administered    Name Date Dose VIS Date Route   Pfizer COVID-19 Vaccine 08/05/2019  2:05 PM 0.3 mL 04/10/2018 Intramuscular   Manufacturer: [employer], [employer]   Lot: [id number]   NDC: [address]-7

## 2021-02-26 IMAGING — US ULTRASOUND ABDOMEN LIMITED
1 series · 13 of 13 positions shown · non-contrast
Comparison: None.

CLINICAL DATA: Right upper quadrant pain, nausea and vomiting for 3
days.

EXAM:
ULTRASOUND ABDOMEN LIMITED RIGHT UPPER QUADRANT

[Series 1: ultrasound abdomen limited · 13 of 13 slices shown]
[im 1/13]
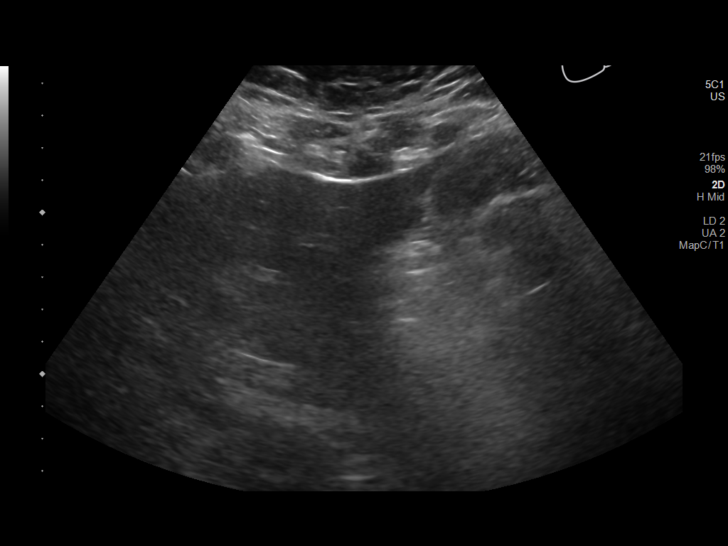
[im 2/13]
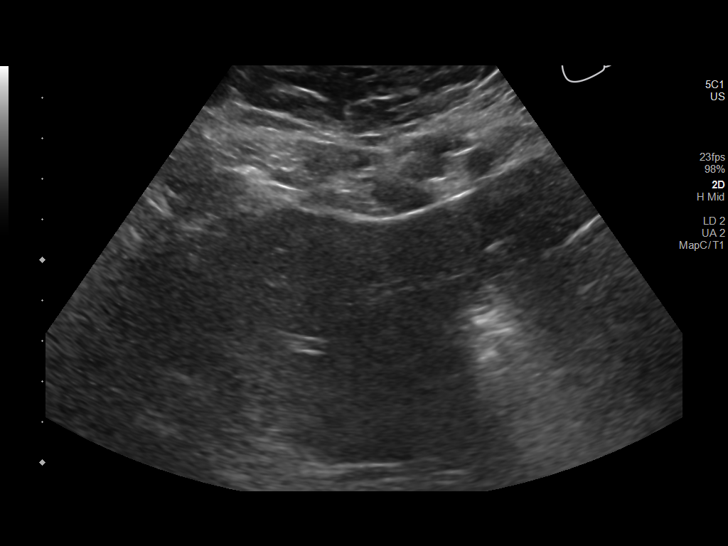
[im 3/13]
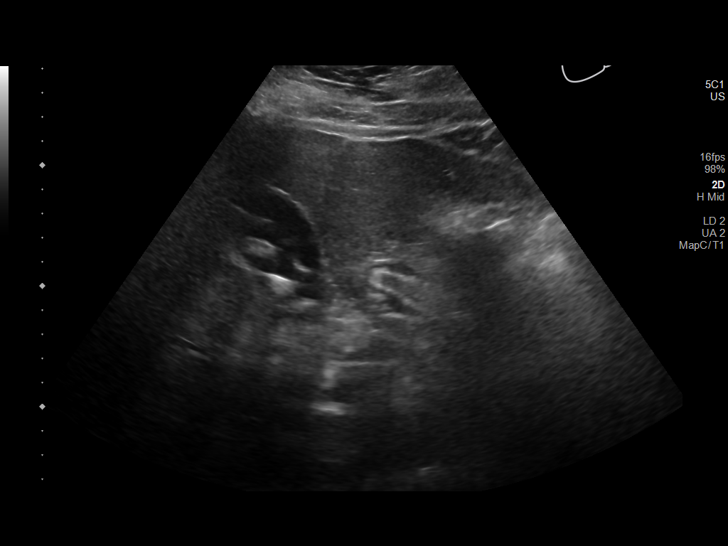
[im 4/13]
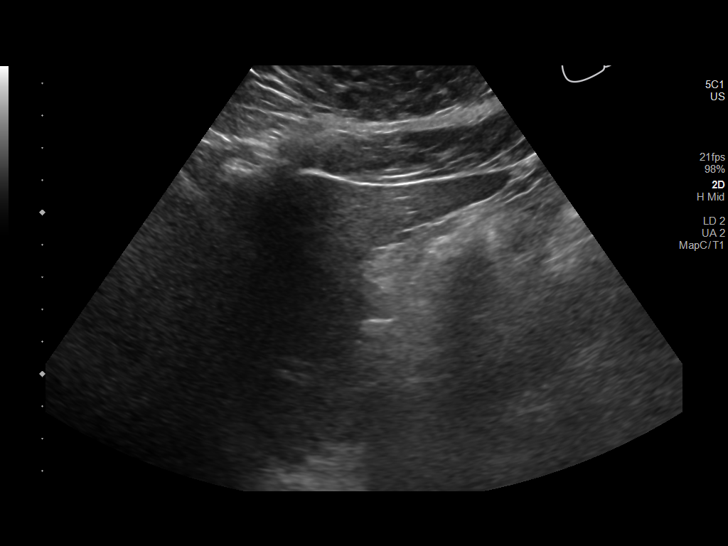
[im 5/13]
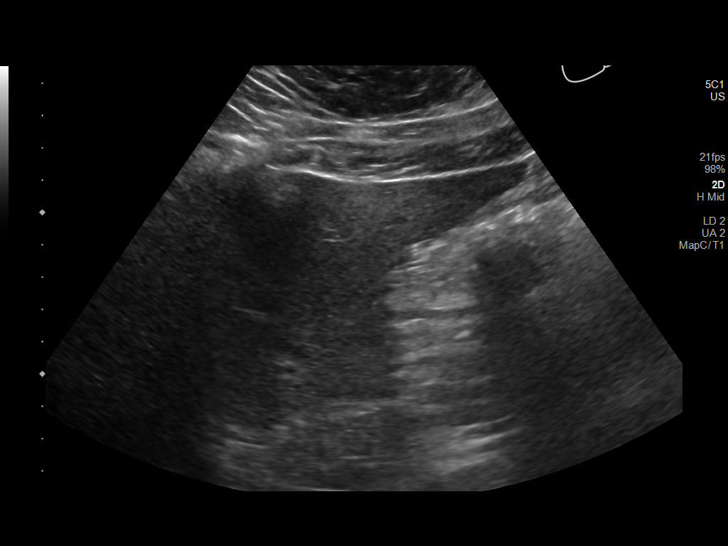
[im 6/13]
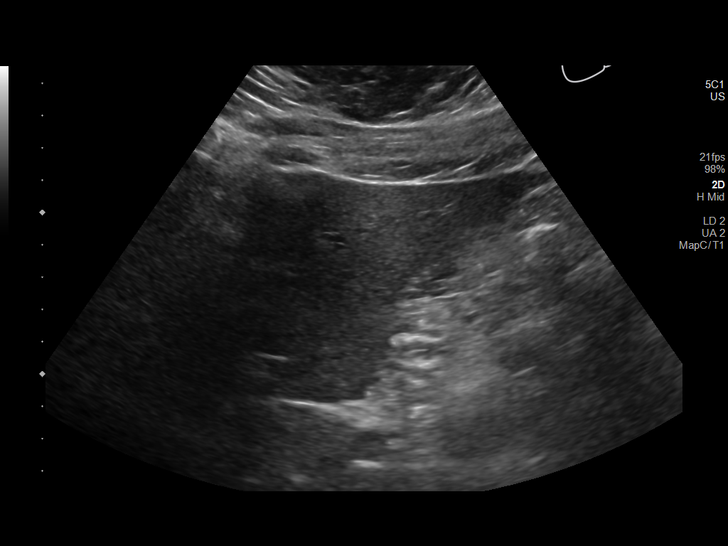
[im 7/13]
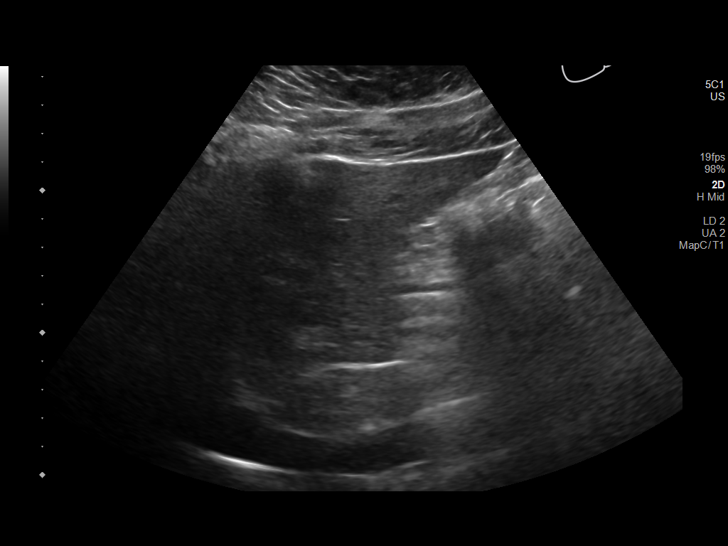
[im 8/13]
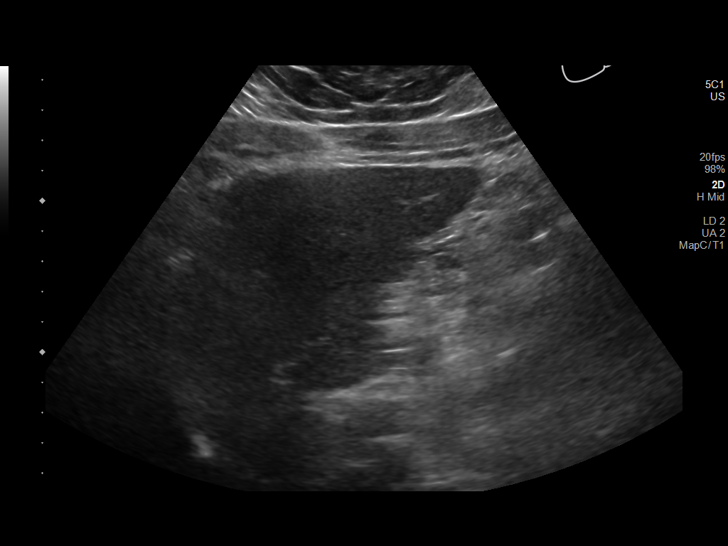
[im 9/13]
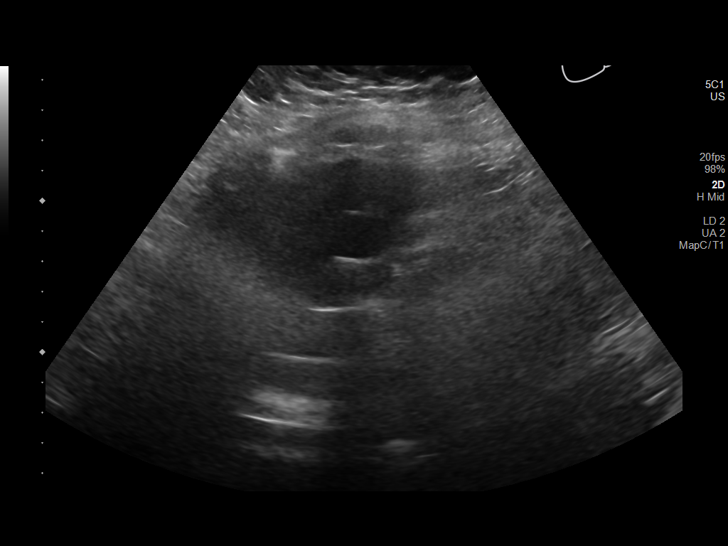
[im 10/13]
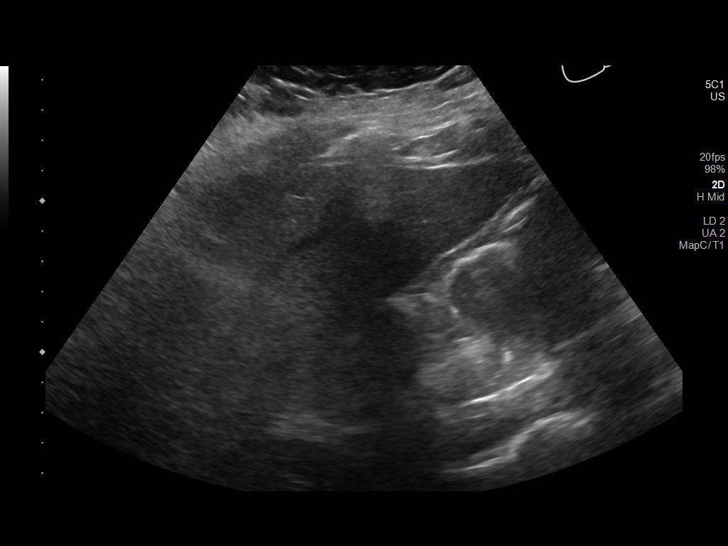
[im 11/13]
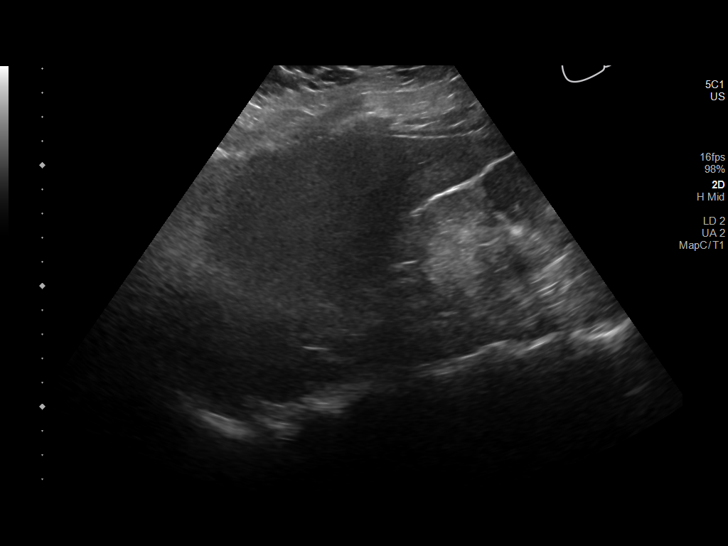
[im 12/13]
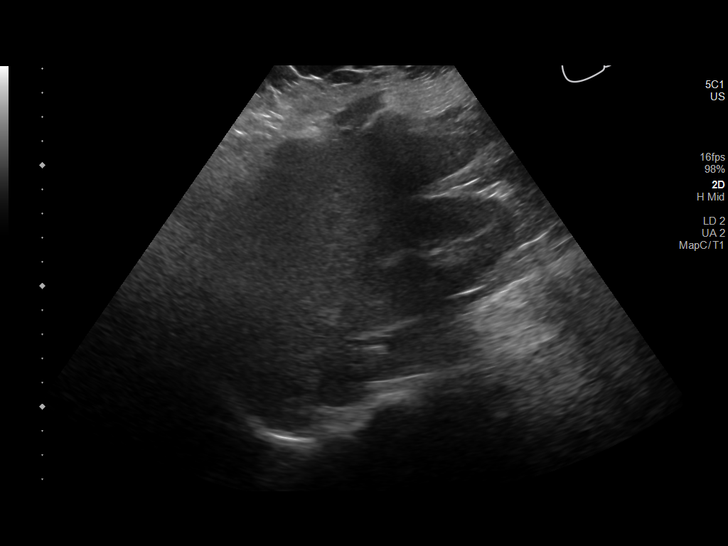
[im 13/13]
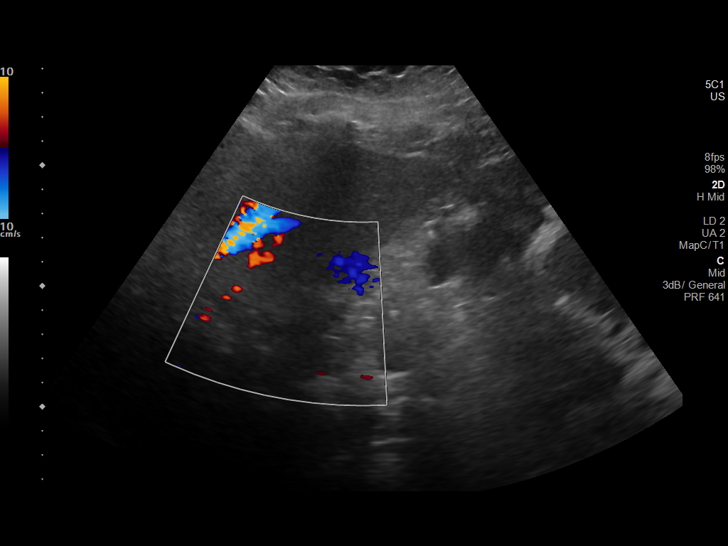

[13 of 13 positions shown; findings below may reference images not displayed]

FINDINGS: Gallbladder:

Stones are identified in the gallbladder measuring up to 1.6 cm in
diameter. There is no gallbladder wall thickening or pericholecystic
fluid. Sonographer reports negative Murphy's sign.

Common bile duct:

Diameter: 0.2 cm

Liver:

No focal lesion identified. Within normal limits in parenchymal
echogenicity. Portal vein is patent on color Doppler imaging with
normal direction of blood flow towards the liver.
IMPRESSION: Gallstones without evidence of cholecystitis.

## 2021-07-26 ENCOUNTER — Encounter: Payer: Self-pay | Admitting: Emergency Medicine

## 2021-07-26 ENCOUNTER — Other Ambulatory Visit: Payer: Self-pay

## 2021-07-26 ENCOUNTER — Ambulatory Visit
Admission: EM | Admit: 2021-07-26 | Discharge: 2021-07-26 | Disposition: A | Discharge status: Home / Self Care | Payer: Commercial Managed Care - PPO

## 2021-07-26 DIAGNOSIS — S134XXA Sprain of ligaments of cervical spine, initial encounter: Secondary | ICD-10-CM | POA: Diagnosis not present

## 2021-07-26 MED ORDER — TIZANIDINE HCL 4 MG PO TABS: 4.0000 mg | ORAL_TABLET | Freq: Three times a day (TID) | ORAL | 0 refills | Status: DC | PRN
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
## 2021-07-26 NOTE — Discharge Instructions (Addendum)
-  Start the muscle relaxer-Zanaflex (tizanidine), up to 3 times daily for muscle spasms and pain.  This can make you drowsy, so take at bedtime or when you do not need to drive or operate machinery. -You can take Tylenol up to 1000 mg 3 times daily, and ibuprofen up to 600 mg 3 times daily with food.  You can take these together, or alternate every 3-4 hours. -Heating pad, gentle stretching  -Symptoms should gradually improve throughout the course of the next week, follow-up if symptoms worsen or persist, or new symptoms like weakness

## 2022-02-03 ENCOUNTER — Ambulatory Visit
Admission: EM | Admit: 2022-02-03 | Discharge: 2022-02-03 | Discharge status: Against Medical Advice | Payer: Commercial Managed Care - PPO

## 2022-02-03 ENCOUNTER — Ambulatory Visit: Admit: 2022-02-03 | Discharge status: Against Medical Advice | Payer: Commercial Managed Care - PPO

## 2022-02-03 ENCOUNTER — Ambulatory Visit: Admit: 2022-02-03 | Disposition: A | Discharge status: Home / Self Care | Payer: Commercial Managed Care - PPO

## 2022-03-20 ENCOUNTER — Ambulatory Visit (HOSPITAL_COMMUNITY)
Admission: RE | Admit: 2022-03-20 | Discharge: 2022-03-20 | Disposition: A | Discharge status: Home / Self Care | Payer: Commercial Managed Care - PPO | Source: Ambulatory Visit | Attending: Internal Medicine | Admitting: Internal Medicine

## 2022-03-20 ENCOUNTER — Encounter (HOSPITAL_COMMUNITY): Payer: Self-pay

## 2022-03-20 VITALS — BP 133/89 | HR 99 | Temp 99.2°F | Resp 16 | Ht 68.0 in | Wt 240.0 lb

## 2022-03-20 DIAGNOSIS — J069 Acute upper respiratory infection, unspecified: Secondary | ICD-10-CM | POA: Diagnosis present

## 2022-03-20 DIAGNOSIS — Z1152 Encounter for screening for COVID-19: Secondary | ICD-10-CM | POA: Insufficient documentation

## 2022-03-20 MED ORDER — IBUPROFEN 800 MG PO TABS
800.0000 mg | ORAL_TABLET | Freq: Once | ORAL | Status: AC
  Administered 2022-03-20: 800 mg via ORAL

## 2022-03-20 MED ORDER — BENZONATATE 100 MG PO CAPS
100.0000 mg | ORAL_CAPSULE | Freq: Three times a day (TID) | ORAL | 0 refills | Status: AC
Start: 2022-03-20 — End: ?

## 2022-03-20 MED ORDER — GUAIFENESIN ER 1200 MG PO TB12: 1200.0000 mg | ORAL_TABLET | Freq: Two times a day (BID) | ORAL | 0 refills | Status: AC

## 2022-03-20 MED ORDER — IBUPROFEN 800 MG PO TABS
ORAL_TABLET | ORAL | Status: AC
  Filled 2022-03-20: qty 1

## 2022-03-20 MED ORDER — PROMETHAZINE-DM 6.25-15 MG/5ML PO SYRP: 5.0000 mL | ORAL_SOLUTION | Freq: Every evening | ORAL | 0 refills | Status: AC | PRN

## 2022-03-20 NOTE — Discharge Instructions (Addendum)
You have a viral upper respiratory infection.  COVID-19 testing is pending. We will call you with results if positive. If your COVID test is positive, you must stay at home until day 6 of symptoms. On day 6, you may go out into public and go back to work, but you must wear a mask until day 11 of symptoms to prevent spread to others.  Use the following medicines to help with symptoms: - Plain Mucinex (guaifenesin) over the counter as directed every 12 hours to thin mucous so that you are able to get it out of your body easier. Drink plenty of water while taking this medication so that it works well in your body (at least 8 cups a day).  - Tylenol 1,000mg and/or ibuprofen 600mg every 6 hours with food as needed for aches/pains or fever/chills.  - Tessalon perles every 8 hours as needed for cough. - Take Promethazine DM cough medication to help with your cough at nighttime so that you are able to sleep. Do not drive, drink alcohol, or go to work while taking this medication since it can make you sleepy. Only take this at nighttime.   1 tablespoon of honey in warm water and/or salt water gargles may also help with symptoms. Humidifier to your room will help add water to the air and reduce coughing.  If you develop any new or worsening symptoms, please return.  If your symptoms are severe, please go to the emergency room.  Follow-up with your primary care provider for further evaluation and management of your symptoms as well as ongoing wellness visits.  I hope you feel better!  

## 2022-03-20 NOTE — ED Triage Notes (Signed)
Chief Complaint: Patient having chest congestion, nasal drainage, sore throat, clammy, taste and smell fading. Chest tenderness, slight productive cough with yellow production.   Onset: Wednesday night   Prescriptions or OTC medications tried: Yes- vic's vapor rub, Mucinex sinus max, benadryl congestion, nasal spray, Robitussin    with little relief  Sick exposure: No  New foods, medications, or products: No  Recent Travel: No

## 2022-03-20 NOTE — ED Provider Notes (Signed)
[place]    CSN: [id number] Arrival date & time: 03/20/22  1002      History   Chief Complaint Chief Complaint  Patient presents with   Nasal Congestion    I have had bad congestion since Wednesday night that has now progressed to no smell and taste at all. Small (sore) chest pains and can barely breathe. I really would like some help finding some relief for my discomfort. Want to check for Covid also - Entered by patient    HPI April Wells is a 37 y.o. female.   Patient presents urgent care for evaluation of nasal congestion, cough, sore throat, and generalized fatigue that started 4 days ago on Wednesday the 31st 2024.  No known sick contacts with similar symptoms.  Cough is productive with clear phlegm.  Nasal congestion is significant and causing sinus headache to the left side of the face.  Denies fever, chills, nausea, vomiting, abdominal pain, shortness of breath, heart palpitations, extremity weakness, and dizziness.  She does report slight chest discomfort to the generalized chest associated with coughing.  Denies history of asthma/allergies.  No other chronic respiratory problems reported.  She smokes marijuana but denies cigarette and other drug use.  Has been using over-the-counter medications without relief of symptoms.     Past Medical History:  Diagnosis Date   Abdominal pain    Herpes genitalia     Patient Active Problem List   Diagnosis Date Noted   Abdominal pain 08/08/2018    Past Surgical History:  Procedure Laterality Date   NO PAST SURGERIES     WISDOM TOOTH EXTRACTION      OB History   No obstetric history on file.      Home Medications    Prior to Admission medications   Medication Sig Start Date End Date Taking? Authorizing Provider  benzonatate (TESSALON) 100 MG capsule Take 1 capsule (100 mg total) by mouth every 8 (eight) hours. 03/20/22  Yes Talbot Grumbling, FNP  Guaifenesin 1200 MG TB12 Take 1 tablet (1,200 mg  total) by mouth in the morning and at bedtime. 03/20/22  Yes Talbot Grumbling, FNP  medroxyPROGESTERone (DEPO-PROVERA) 150 MG/ML injection Inject 150 mg into the muscle every 3 (three) months.   Yes [provider]  phentermine (ADIPEX-P) 37.5 MG tablet Take 1 tablet by mouth daily. 07/11/18 03/20/22 Yes [provider]  promethazine-dextromethorphan (PROMETHAZINE-DM) 6.25-15 MG/5ML syrup Take 5 mLs by mouth at bedtime as needed for cough. 03/20/22  Yes Talbot Grumbling, FNP  azithromycin (ZITHROMAX) 250 MG tablet Take 1 tablet (250 mg total) by mouth daily. Take first 2 tablets together, then 1 every day until finished. Patient not taking: Reported on 07/26/2021 02/16/19   Hall-Potvin, [place], PA-C  ibuprofen (ADVIL) 200 MG tablet Take 2 tablets (400 mg total) by mouth every 6 (six) hours as needed for moderate pain. 08/10/18   Norm Parcel, PA-C  potassium chloride (KLOR-CON) 10 MEQ tablet Take 10 mEq by mouth daily. 07/16/21   [provider]  tiZANidine (ZANAFLEX) 4 MG tablet Take 1 tablet (4 mg total) by mouth every 8 (eight) hours as needed for muscle spasms. 07/26/21   Hazel Sams, PA-C  topiramate (TOPAMAX) 50 MG tablet Take 50 mg by mouth 2 (two) times a day. 07/04/18 07/04/19  [provider]    Family History Family History  Problem Relation Age of Onset   Healthy Mother    Healthy Father     Social  History Social History   Tobacco Use   Smoking status: Never   Smokeless tobacco: Never  Vaping Use   Vaping Use: Never used  Substance Use Topics   Alcohol use: Yes   Drug use: No     Allergies   Patient has no known allergies.   Review of Systems Review of Systems Per HPI  Physical Exam Triage Vital Signs ED Triage Vitals  Enc Vitals Group     BP 03/20/22 1021 133/89     Pulse Rate 03/20/22 1021 99     Resp 03/20/22 1021 16     Temp 03/20/22 1021 99.2 F (37.3 C)     Temp Source 03/20/22 1021 Oral     SpO2 03/20/22  1021 98 %     Weight 03/20/22 1021 240 lb (108.9 kg)     Height 03/20/22 1021 5\' 8"  (1.727 m)     Head Circumference --      Peak Flow --      Pain Score 03/20/22 1019 2     Pain Loc --      Pain Edu? --      Excl. in GC? --    No data found.  Updated Vital Signs BP 133/89 (BP Location: Right Arm)   Pulse 99   Temp 99.2 F (37.3 C) (Oral)   Resp 16   Ht 5\' 8"  (1.727 m)   Wt 240 lb (108.9 kg)   LMP  (LMP Unknown)   SpO2 98%   BMI 36.49 kg/m   Visual Acuity Right Eye Distance:   Left Eye Distance:   Bilateral Distance:    Right Eye Near:   Left Eye Near:    Bilateral Near:     Physical Exam Vitals and nursing note reviewed.  Constitutional:      Appearance: She is not ill-appearing or toxic-appearing.  HENT:     Head: Normocephalic and atraumatic.     Right Ear: Hearing, tympanic membrane, ear canal and external ear normal.     Left Ear: Hearing, tympanic membrane, ear canal and external ear normal.     Nose: Congestion present.     Mouth/Throat:     Lips: Pink.     Mouth: Mucous membranes are moist.     Pharynx: No posterior oropharyngeal erythema.  Eyes:     General: Lids are normal. Vision grossly intact. Gaze aligned appropriately.        Right eye: No discharge.        Left eye: No discharge.     Extraocular Movements: Extraocular movements intact.     Conjunctiva/sclera: Conjunctivae normal.  Neck:     Vascular: No carotid bruit.  Cardiovascular:     Rate and Rhythm: Normal rate and regular rhythm.     Heart sounds: Normal heart sounds, S1 normal and S2 normal.  Pulmonary:     Effort: Pulmonary effort is normal. No respiratory distress.     Breath sounds: Normal breath sounds and air entry.  Musculoskeletal:     Cervical back: Neck supple.  Lymphadenopathy:     Cervical: No cervical adenopathy.  Skin:    General: Skin is warm and dry.     Capillary Refill: Capillary refill takes less than 2 seconds.     Findings: No rash.  Neurological:      General: No focal deficit present.     Mental Status: She is alert and oriented to person, place, and time. Mental status is at baseline.  Cranial Nerves: No dysarthria or facial asymmetry.  Psychiatric:        Mood and Affect: Mood normal.        Speech: Speech normal.        Behavior: Behavior normal.        Thought Content: Thought content normal.        Judgment: Judgment normal.      UC Treatments / Results  Labs (all labs ordered are listed, but only abnormal results are displayed) Labs Reviewed - No data to display  EKG   Radiology No results found.  Procedures Procedures (including critical care time)  Medications Ordered in UC Medications  ibuprofen (ADVIL) tablet 800 mg (800 mg Oral Given 03/20/22 1051)    Initial Impression / Assessment and Plan / UC Course  I have reviewed the triage vital signs and the nursing notes.  Pertinent labs & imaging results that were available during my care of the patient were reviewed by me and considered in my medical decision making (see chart for details).   1. Viral URI with cough Symptoms and physical exam consistent with a viral upper respiratory tract infection that will likely resolve with rest, fluids, and prescriptions for symptomatic relief. Deferred imaging based on stable cardiopulmonary exam and hemodynamically stable vital signs.  COVID-19 testing is pending per patient request.  We will call patient if this is positive.  Quarantine guidelines discussed. Currently on day 5 of symptoms and does qualify for antiviral therapy.   Patient given ibuprofen in clinic today for sinus headache.   Tessalon Perles, guaifenesin, and Promethazine DM sent to pharmacy for symptomatic relief to be taken as prescribed.  May continue taking over the counter medications as directed for further symptomatic relief.  Drowsiness precautions discussed regarding promethazine DM prescription.  Nonpharmacologic interventions for symptom relief  provided and after visit summary below. Advised to push fluids to stay well hydrated while recovering from viral illness.   Discussed physical exam and available lab work findings in clinic with patient.  Counseled patient regarding appropriate use of medications and potential side effects for all medications recommended or prescribed today. Discussed red flag signs and symptoms of worsening condition,when to call the PCP office, return to urgent care, and when to seek higher level of care in the emergency department. Patient verbalizes understanding and agreement with plan. All questions answered. Patient discharged in stable condition.    Final Clinical Impressions(s) / UC Diagnoses   Final diagnoses:  Viral URI with cough     Discharge Instructions      You have a viral upper respiratory infection.  COVID-19 testing is pending. We will call you with results if positive. If your COVID test is positive, you must stay at home until day 6 of symptoms. On day 6, you may go out into public and go back to work, but you must wear a mask until day 11 of symptoms to prevent spread to others.  Use the following medicines to help with symptoms: - Plain Mucinex (guaifenesin) over the counter as directed every 12 hours to thin mucous so that you are able to get it out of your body easier. Drink plenty of water while taking this medication so that it works well in your body (at least 8 cups a day).  - Tylenol 1,000mg  and/or ibuprofen 600mg  every 6 hours with food as needed for aches/pains or fever/chills.  - Tessalon perles every 8 hours as needed for cough. - Take Promethazine DM cough medication  to help with your cough at nighttime so that you are able to sleep. Do not drive, drink alcohol, or go to work while taking this medication since it can make you sleepy. Only take this at nighttime.   1 tablespoon of honey in warm water and/or salt water gargles may also help with symptoms. Humidifier to your  room will help add water to the air and reduce coughing.  If you develop any new or worsening symptoms, please return.  If your symptoms are severe, please go to the emergency room.  Follow-up with your primary care provider for further evaluation and management of your symptoms as well as ongoing wellness visits.  I hope you feel better!    ED Prescriptions     Medication Sig Dispense Auth. Provider   benzonatate (TESSALON) 100 MG capsule Take 1 capsule (100 mg total) by mouth every 8 (eight) hours. 21 capsule Joella Prince M, FNP   Guaifenesin 1200 MG TB12 Take 1 tablet (1,200 mg total) by mouth in the morning and at bedtime. 14 tablet Talbot Grumbling, FNP   promethazine-dextromethorphan (PROMETHAZINE-DM) 6.25-15 MG/5ML syrup Take 5 mLs by mouth at bedtime as needed for cough. 118 mL Talbot Grumbling, FNP      PDMP not reviewed this encounter.   Talbot Grumbling, [place] 03/20/22 1055

## 2022-03-21 LAB — SARS CORONAVIRUS 2 (TAT 6-24 HRS): SARS Coronavirus 2: NEGATIVE

## 2023-03-07 ENCOUNTER — Ambulatory Visit
Admission: EM | Admit: 2023-03-07 | Discharge: 2023-03-07 | Disposition: A | Discharge status: Home / Self Care | Payer: Commercial Managed Care - PPO | Attending: Emergency Medicine | Admitting: Emergency Medicine

## 2023-03-07 DIAGNOSIS — M7918 Myalgia, other site: Secondary | ICD-10-CM | POA: Diagnosis not present

## 2023-03-07 MED ORDER — CYCLOBENZAPRINE HCL 10 MG PO TABS: 10.0000 mg | ORAL_TABLET | Freq: Two times a day (BID) | ORAL | 0 refills | Status: AC | PRN

## 2023-03-07 MED ORDER — IBUPROFEN 600 MG PO TABS: 600.0000 mg | ORAL_TABLET | Freq: Four times a day (QID) | ORAL | 0 refills | Status: AC | PRN
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
## 2023-03-07 NOTE — Discharge Instructions (Addendum)
Take the ibuprofen as directed for discomfort.  Take the muscle relaxer as needed for muscle spasm; Do not drive, operate machinery, or drink alcohol with this medication as it can cause drowsiness.   Follow up with your primary care provider.  Go to the emergency department if you have worsening symptoms.
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
